# Patient Record
Sex: Female | Born: 1955
Health system: Southern US, Community
[De-identification: ages and names within clinical notes are randomized; demographics above are authoritative.]

## PROBLEM LIST (undated history)

## (undated) DIAGNOSIS — U071 COVID-19: Secondary | ICD-10-CM

## (undated) DIAGNOSIS — E782 Mixed hyperlipidemia: Secondary | ICD-10-CM

## (undated) DIAGNOSIS — R5383 Other fatigue: Principal | ICD-10-CM

## (undated) DIAGNOSIS — Z9889 Other specified postprocedural states: Secondary | ICD-10-CM

## (undated) DIAGNOSIS — D649 Anemia, unspecified: Secondary | ICD-10-CM

## (undated) DIAGNOSIS — I209 Angina pectoris, unspecified: Secondary | ICD-10-CM

## (undated) DIAGNOSIS — R112 Nausea with vomiting, unspecified: Secondary | ICD-10-CM

## (undated) DIAGNOSIS — N200 Calculus of kidney: Secondary | ICD-10-CM

## (undated) HISTORY — DX: Mixed hyperlipidemia: E78.2

## (undated) HISTORY — DX: Other fatigue: R53.83

## (undated) HISTORY — PX: CHOLECYSTECTOMY: SHX55

## (undated) HISTORY — DX: COVID-19: U07.1

## (undated) HISTORY — PX: TUBAL LIGATION: SHX77

---

## 2003-02-22 ENCOUNTER — Ambulatory Visit (HOSPITAL_COMMUNITY): Admission: RE | Admit: 2003-02-22 | Discharge: 2003-02-22 | Payer: Self-pay | Admitting: Family Medicine

## 2003-02-22 ENCOUNTER — Encounter: Payer: Self-pay | Admitting: Family Medicine

## 2007-04-24 ENCOUNTER — Emergency Department (HOSPITAL_COMMUNITY): Admission: EM | Admit: 2007-04-24 | Discharge: 2007-04-24 | Payer: Self-pay | Admitting: Emergency Medicine

## 2009-07-09 DIAGNOSIS — D509 Iron deficiency anemia, unspecified: Secondary | ICD-10-CM

## 2009-07-09 HISTORY — DX: Iron deficiency anemia, unspecified: D50.9

## 2010-04-08 DIAGNOSIS — D649 Anemia, unspecified: Secondary | ICD-10-CM

## 2010-04-08 HISTORY — DX: Anemia, unspecified: D64.9

## 2010-07-09 DIAGNOSIS — N200 Calculus of kidney: Secondary | ICD-10-CM

## 2010-07-09 HISTORY — DX: Calculus of kidney: N20.0

## 2010-12-08 DIAGNOSIS — N200 Calculus of kidney: Secondary | ICD-10-CM

## 2010-12-08 HISTORY — DX: Calculus of kidney: N20.0

## 2010-12-09 ENCOUNTER — Emergency Department (HOSPITAL_COMMUNITY): Payer: BC Managed Care – PPO

## 2010-12-09 ENCOUNTER — Emergency Department (HOSPITAL_COMMUNITY)
Admission: EM | Admit: 2010-12-09 | Discharge: 2010-12-09 | Disposition: A | Discharge status: Home / Self Care | Payer: BC Managed Care – PPO | Attending: Emergency Medicine | Admitting: Emergency Medicine

## 2010-12-09 DIAGNOSIS — R1031 Right lower quadrant pain: Secondary | ICD-10-CM | POA: Insufficient documentation

## 2010-12-09 DIAGNOSIS — R110 Nausea: Secondary | ICD-10-CM | POA: Insufficient documentation

## 2010-12-09 DIAGNOSIS — N201 Calculus of ureter: Secondary | ICD-10-CM | POA: Insufficient documentation

## 2010-12-09 LAB — URINALYSIS, ROUTINE W REFLEX MICROSCOPIC
Glucose, UA: NEGATIVE mg/dL
Nitrite: NEGATIVE
Protein, ur: 100 mg/dL — AB
Specific Gravity, Urine: >1.03 — ABNORMAL HIGH (ref 1.005–1.030)
Urobilinogen, UA: 0.2 mg/dL (ref 0.0–1.0)
pH: 5.5 (ref 5.0–8.0)

## 2010-12-09 LAB — URINE MICROSCOPIC-ADD ON

## 2011-04-18 LAB — POCT I-STAT CREATININE: Creatinine, Ser: 1.1

## 2011-04-18 LAB — I-STAT 8, (EC8 V) (CONVERTED LAB)
BUN: 12
Glucose, Bld: 112 — ABNORMAL HIGH
HCT: 42
Hemoglobin: 14.3
Potassium: 4.3
Sodium: 141
TCO2: 24

## 2011-04-18 LAB — POCT CARDIAC MARKERS: Operator id: 207241

## 2011-05-15 ENCOUNTER — Encounter (HOSPITAL_BASED_OUTPATIENT_CLINIC_OR_DEPARTMENT_OTHER): Payer: Self-pay | Admitting: *Deleted

## 2011-05-15 NOTE — Progress Notes (Signed)
Pt instructed npo p mn 11/7.  To [facility] @ 0615 11/8. Needs ekg on arrival.  To [facility] in [place] [phone]) for cbc.  Labs to be faxed.

## 2011-05-16 NOTE — H&P (Addendum)
55 yo presents for surgical mngt of irregular menses.  US shows possible endometrial polyp.    PMHx:  Kidney stones PSHx:  Cholecystectomy, BTL OBHx:  SVD x 2 All:  Latex Meds:  None SHx:  No tobacco or drugs  AF, VSS - BP mildly elevated Gen - NAD CV - RRR Lungs-clear bilaterlly ABd - soft, NT PV - deferred  [place] - 8mm density within endometrial cavity, probable polyp  A/P:  Irregular VB and probably endometrial mass - plan for hysteroscopy, D&C w/ removal of endometrial polyp  Discussed procedure again and re-examined pt.  Informed consent obtained.

## 2011-05-17 ENCOUNTER — Encounter (HOSPITAL_BASED_OUTPATIENT_CLINIC_OR_DEPARTMENT_OTHER): Payer: Self-pay | Admitting: Anesthesiology

## 2011-05-17 ENCOUNTER — Ambulatory Visit (HOSPITAL_BASED_OUTPATIENT_CLINIC_OR_DEPARTMENT_OTHER)
Admission: RE | Admit: 2011-05-17 | Discharge: 2011-05-17 | Disposition: A | Discharge status: Home / Self Care | Payer: BC Managed Care – PPO | Source: Ambulatory Visit | Attending: Obstetrics and Gynecology | Admitting: Obstetrics and Gynecology

## 2011-05-17 ENCOUNTER — Ambulatory Visit (HOSPITAL_BASED_OUTPATIENT_CLINIC_OR_DEPARTMENT_OTHER): Payer: BC Managed Care – PPO | Admitting: Anesthesiology

## 2011-05-17 ENCOUNTER — Other Ambulatory Visit: Payer: Self-pay | Admitting: Obstetrics and Gynecology

## 2011-05-17 ENCOUNTER — Other Ambulatory Visit: Payer: Self-pay

## 2011-05-17 ENCOUNTER — Encounter (HOSPITAL_BASED_OUTPATIENT_CLINIC_OR_DEPARTMENT_OTHER): Payer: Self-pay | Admitting: Certified Registered"

## 2011-05-17 ENCOUNTER — Encounter (HOSPITAL_BASED_OUTPATIENT_CLINIC_OR_DEPARTMENT_OTHER)
Admission: RE | Disposition: A | Discharge status: Home / Self Care | Payer: Self-pay | Source: Ambulatory Visit | Attending: Obstetrics and Gynecology

## 2011-05-17 DIAGNOSIS — N840 Polyp of corpus uteri: Secondary | ICD-10-CM | POA: Insufficient documentation

## 2011-05-17 DIAGNOSIS — N926 Irregular menstruation, unspecified: Secondary | ICD-10-CM | POA: Insufficient documentation

## 2011-05-17 HISTORY — PX: HYSTEROSCOPY WITH D & C: SHX1775

## 2011-05-17 HISTORY — DX: Calculus of kidney: N20.0

## 2011-05-17 HISTORY — DX: Other specified postprocedural states: Z98.890

## 2011-05-17 HISTORY — DX: Nausea with vomiting, unspecified: R11.2

## 2011-05-17 HISTORY — DX: Anemia, unspecified: D64.9

## 2011-05-17 HISTORY — DX: Angina pectoris, unspecified: I20.9

## 2011-05-17 SURGERY — DILATATION AND CURETTAGE /HYSTEROSCOPY
Anesthesia: Choice | Site: Uterus | Wound class: Clean Contaminated

## 2011-05-17 MED ORDER — LIDOCAINE HCL 1 % IJ SOLN
INTRAMUSCULAR | Status: DC | PRN
  Administered 2011-05-17: 10 mL

## 2011-05-17 MED ORDER — FENTANYL CITRATE 0.05 MG/ML IJ SOLN
INTRAMUSCULAR | Status: DC | PRN
  Administered 2011-05-17: 100 ug via INTRAVENOUS

## 2011-05-17 MED ORDER — DEXTROSE 5 % IV SOLN
1.0000 g | INTRAVENOUS | Status: AC
  Administered 2011-05-17: 1 g via INTRAVENOUS

## 2011-05-17 MED ORDER — GLYCINE 1.5 % IR SOLN
Status: DC | PRN
  Administered 2011-05-17: 3000 mL

## 2011-05-17 MED ORDER — ONDANSETRON HCL 4 MG/2ML IJ SOLN
INTRAMUSCULAR | Status: DC | PRN
  Administered 2011-05-17: 4 mg via INTRAVENOUS

## 2011-05-17 MED ORDER — SCOPOLAMINE 1 MG/3DAYS TD PT72
1.0000 | MEDICATED_PATCH | TRANSDERMAL | Status: DC
  Administered 2011-05-17: 1.5 mg via TRANSDERMAL

## 2011-05-17 MED ORDER — DEXAMETHASONE SODIUM PHOSPHATE 4 MG/ML IJ SOLN
INTRAMUSCULAR | Status: DC | PRN
  Administered 2011-05-17: 8 mg via INTRAVENOUS

## 2011-05-17 MED ORDER — LIDOCAINE HCL (CARDIAC) 20 MG/ML IV SOLN
INTRAVENOUS | Status: DC | PRN
  Administered 2011-05-17: 80 mg via INTRAVENOUS

## 2011-05-17 MED ORDER — DROPERIDOL 2.5 MG/ML IJ SOLN: 0.6250 mg | INTRAMUSCULAR | Status: DC | PRN

## 2011-05-17 MED ORDER — LACTATED RINGERS IV SOLN
INTRAVENOUS | Status: DC
  Administered 2011-05-17: 07:00:00 via INTRAVENOUS

## 2011-05-17 MED ORDER — PROPOFOL 10 MG/ML IV EMUL
INTRAVENOUS | Status: DC | PRN
  Administered 2011-05-17: 200 mg via INTRAVENOUS

## 2011-05-17 MED ORDER — FENTANYL CITRATE 0.05 MG/ML IJ SOLN: 25.0000 ug | INTRAMUSCULAR | Status: DC | PRN

## 2011-05-17 MED ORDER — HYDROCODONE-ACETAMINOPHEN 5-500 MG PO TABS: 1.0000 | ORAL_TABLET | Freq: Four times a day (QID) | ORAL | Status: AC | PRN

## 2011-05-17 MED ORDER — MIDAZOLAM HCL 5 MG/5ML IJ SOLN
INTRAMUSCULAR | Status: DC | PRN
  Administered 2011-05-17: 2 mg via INTRAVENOUS

## 2011-05-17 SURGICAL SUPPLY — 30 items
CANISTER SUCTION 2500CC (MISCELLANEOUS) ×2 IMPLANT
CLOTH BEACON ORANGE TIMEOUT ST (SAFETY) ×2 IMPLANT
CORD ACTIVE DISPOSABLE (ELECTRODE)
CORD ELECTRO ACTIVE DISP (ELECTRODE) ×1 IMPLANT
COVER TABLE BACK 60X90 (DRAPES) ×2 IMPLANT
DRAPE CAMERA CLOSED 9X96 (DRAPES) ×2 IMPLANT
DRAPE LG THREE QUARTER DISP (DRAPES) ×2 IMPLANT
DRESSING TELFA 8X3 (GAUZE/BANDAGES/DRESSINGS) ×2 IMPLANT
ELECT LOOP GYNE PRO 24FR (CUTTING LOOP) ×2
ELECT REM PT RETURN 9FT ADLT (ELECTROSURGICAL) ×2
ELECT VAPORTRODE GRVD BAR (ELECTRODE) IMPLANT
ELECTRODE LOOP GYNE PRO 24FR (CUTTING LOOP) ×1 IMPLANT
ELECTRODE REM PT RTRN 9FT ADLT (ELECTROSURGICAL) ×1 IMPLANT
ELECTRODE ROLLER BARREL 22FR (ELECTROSURGICAL) IMPLANT
ELECTRODE VAPORCUT 22FR (ELECTROSURGICAL) IMPLANT
GLOVE BIO SURGEON STRL SZ 6.5 (GLOVE) ×3 IMPLANT
GLOVE BIOGEL PI IND STRL 7.0 (GLOVE) ×2 IMPLANT
GLOVE BIOGEL PI INDICATOR 7.0 (GLOVE) ×2
GLOVE ECLIPSE 6.0 STRL STRAW (GLOVE) ×1 IMPLANT
GOWN PREVENTION PLUS LG XLONG (DISPOSABLE) ×2 IMPLANT
GOWN SURGICAL LARGE (GOWNS) ×1 IMPLANT
LEGGING LITHOTOMY PAIR STRL (DRAPES) ×2 IMPLANT
LOOP ANGLED CUTTING 22FR (CUTTING LOOP) IMPLANT
PACK BASIN DAY SURGERY FS (CUSTOM PROCEDURE TRAY) ×2 IMPLANT
PAD OB MATERNITY 4.3X12.25 (PERSONAL CARE ITEMS) ×2 IMPLANT
PAD PREP 24X48 CUFFED NSTRL (MISCELLANEOUS) ×2 IMPLANT
TOWEL OR 17X24 6PK STRL BLUE (TOWEL DISPOSABLE) ×4 IMPLANT
TRAY DSU PREP LF (CUSTOM PROCEDURE TRAY) ×2 IMPLANT
TUBING HYDROFLEX HYSTEROSCOPY (TUBING) ×2 IMPLANT
WATER STERILE IRR 500ML POUR (IV SOLUTION) ×2 IMPLANT

## 2011-05-17 NOTE — Anesthesia Preprocedure Evaluation (Addendum)
Anesthesia Evaluation  Patient identified by MRN, date of birth, ID band Patient awake    Reviewed: Allergy & Precautions, H&P , NPO status , Patient's Chart, lab work & pertinent test results  History of Anesthesia Complications (+) PONV  Airway Mallampati: II TM Distance: >3 FB Neck ROM: Full    Dental No notable dental hx. (+) Teeth Intact   Pulmonary neg pulmonary ROS,  clear to auscultation  Pulmonary exam normal       Cardiovascular Angina: anxiety. w/u neg. neg cardio ROS Regular Normal    Neuro/Psych Negative Neurological ROS  Negative Psych ROS   GI/Hepatic negative GI ROS, Neg liver ROS,   Endo/Other  Negative Endocrine ROS  Renal/GU negative Renal ROS  Genitourinary negative   Musculoskeletal negative musculoskeletal ROS (+)   Abdominal   Peds negative pediatric ROS (+)  Hematology negative hematology ROS (+)   Anesthesia Other Findings   Reproductive/Obstetrics negative OB ROS                          Anesthesia Physical Anesthesia Plan  ASA: II  Anesthesia Plan: General   Post-op Pain Management:    Induction: Intravenous  Airway Management Planned: LMA  Additional Equipment:   Intra-op Plan:   Post-operative Plan: Extubation in OR  Informed Consent: I have reviewed the patients History and Physical, chart, labs and discussed the procedure including the risks, benefits and alternatives for the proposed anesthesia with the patient or authorized representative who has indicated his/her understanding and acceptance.   Dental advisory given  Plan Discussed with: CRNA  Anesthesia Plan Comments:         Anesthesia Quick Evaluation

## 2011-05-17 NOTE — Transfer of Care (Signed)
Immediate Anesthesia Transfer of Care Note  Patient: Kari Branch  Procedure(s) Performed:  DILATATION AND CURETTAGE (D&C) /HYSTEROSCOPY - HYSTEROSCOPY D & C  Patient Location: PACU  Anesthesia Type: General  Level of Consciousness: sedated and unresponsive  Airway & Oxygen Therapy: Patient Spontanous Breathing and Patient connected to face mask oxygen  Post-op Assessment: Report given to PACU RN and Post -op Vital signs reviewed and stable  Post vital signs: Reviewed and stable  Complications: No apparent anesthesia complications

## 2011-05-17 NOTE — Anesthesia Postprocedure Evaluation (Signed)
  Anesthesia Post-op Note  Patient: Kari Branch  Procedure(s) Performed:  DILATATION AND CURETTAGE (D&C) /HYSTEROSCOPY - HYSTEROSCOPY D & C  Patient Location: PACU  Anesthesia Type: General  Level of Consciousness: awake and alert   Airway and Oxygen Therapy: Patient Spontanous Breathing  Post-op Pain: mild  Post-op Assessment: Post-op Vital signs reviewed, Patient's Cardiovascular Status Stable, Respiratory Function Stable, Patent Airway and No signs of Nausea or vomiting  Post-op Vital Signs: stable  Complications: No apparent anesthesia complications

## 2011-05-17 NOTE — Op Note (Signed)
NAMESTEPHAIE, DARDIS NO.:  [id number]  MEDICAL RECORD NO.:  [id number]  LOCATION:  [facility]                          FACILITY:  [facility]  PHYSICIAN:  Zelphia Cairo, MD    DATE OF BIRTH:  Feb 22, 1956  DATE OF PROCEDURE:  05/17/2011 DATE OF DISCHARGE:  12/09/2010                              OPERATIVE REPORT   PREOPERATIVE DIAGNOSES: 1. Irregular vaginal bleeding. 2. Endometrial mass.  POSTOPERATIVE DIAGNOSES: 1. Irregular vaginal bleeding. 2. Endometrial mass, path pending  PROCEDURES: 1. Cervical block. 2. Hysteroscopy. 3. Dilation and curettage.  SURGEON:  Zelphia Cairo, MD  ANESTHESIA:  General.  COMPLICATIONS:  None.  SPECIMEN:  Endometrial curettings with polyp.  CONDITION:  Stable to recovery room.  PROCEDURE:  Neriyah was taken to the operating room after informed consent was obtained.  She was given general anesthesia and placed in the dorsal lithotomy position using Allen stirrups.  She was prepped and draped in sterile fashion.  Bivalve speculum was placed in the vagina.  1 cc of 1% lidocaine was injected at 12 o'clock on the cervix and a single-tooth tenaculum was placed on the anterior lip of the cervix.  The remaining 9 cc of 1% lidocaine was then used to perform a cervical block.  The cervix was serially dilated using Pratt dilators and the diagnostic hysteroscope was inserted under direct visualization.  The endometrial cavity appeared atrophic.  A small polyp was noted at the posterior uterine cavity just inside of the internal cervical os.  Otherwise, no masses or abnormalities were noted.  Diagnostic hysteroscope was removed and a gentle curetting was performed.  Specimen was placed on Telfa and passed off to be sent to Pathology.  Diagnostic hysteroscope was inserted.  The polyp was noted to be removed.  No other masses or abnormalities were noted.  Hysteroscope was removed.  Tenaculum was removed from the cervix.  The cervix  was hemostatic.  Speculum was removed.  The patient was taken to the recovery room in stable condition.  Sponge, lap, needle, and instrument counts were correct x2.     Zelphia Cairo, MD     GA/MEDQ  D:  05/17/2011  T:  05/17/2011  Job:  [id number]

## 2011-05-17 NOTE — Anesthesia Procedure Notes (Addendum)
Performed by: Radford Pax   Procedure Name: LMA Insertion Date/Time: 05/17/2011 7:30 AM Performed by: Radford Pax Pre-anesthesia Checklist: Patient identified, Emergency Drugs available, Suction available, Patient being monitored and Timeout performed Patient Re-evaluated:Patient Re-evaluated prior to inductionOxygen Delivery Method: Circle System Utilized Preoxygenation: Pre-oxygenation with 100% oxygen Intubation Type: IV induction Ventilation: Mask ventilation without difficulty LMA: LMA inserted LMA Size: 4.0 Number of attempts: 1 Tube secured with: Tape

## 2011-06-05 ENCOUNTER — Encounter (HOSPITAL_BASED_OUTPATIENT_CLINIC_OR_DEPARTMENT_OTHER): Payer: Self-pay | Admitting: Obstetrics and Gynecology

## 2011-06-11 NOTE — Op Note (Signed)
NAMEBERT, GIVANS NO.:  [id number]  MEDICAL RECORD NO.:  [id number]  LOCATION:                                 FACILITY:  PHYSICIAN:  Zelphia Cairo, MD         DATE OF BIRTH:  DATE OF PROCEDURE:  05/17/2011 DATE OF DISCHARGE:                              OPERATIVE REPORT   PREOPERATIVE DIAGNOSIS:  Irregular menses, suspect endometrial polyp.  POSTOPERATIVE DIAGNOSIS:  Irregular menses, suspect endometrial polyp, pathology pending.  PROCEDURE: 1. Hysteroscopy. 2. Dilation and curettage. 3. Polypectomy.  SURGEON:  Zelphia Cairo, MD  ANESTHESIA:  General.  COMPLICATIONS:  None.  CONDITION:  Stable to recovery room.  PROCEDURE:  Kari Branch was taken to the operating room.  After informed consent was obtained, she was given general anesthesia and placed in the dorsal lithotomy position using Allen stirrups.  She was prepped and draped in sterile fashion and an in-and-out catheter was used to drain her bladder.  Bivalve speculum was placed in the vagina, and a single- tooth tenaculum placed on the anterior lip of the cervix.  The cervix was serially dilated with [facility] [place] dilators, and the diagnostic hysteroscope was inserted under direct visualization.  Endometrial polyp was identified.  The remainder of the endometrial cavity appeared normal.  Hysteroscope was removed, and a gentle curetting was then performed.  Specimen was placed on Telfa and passed off to be sent to pathology.  Polypoid material was noted to be contained within curettings.  Hysteroscope was reinserted, and no other masses or abnormalities were noted.  Polyp was no longer present.  All instruments were removed from the vagina.  The cervix was hemostatic.  The speculum was removed.  The patient was extubated and taken to the recovery room in stable condition.  Sponge, lap, needle, and instrument counts were correct x2.     Zelphia Cairo, MD    GA/MEDQ  D:  06/11/2011  T:   06/11/2011  Job:  [id number]

## 2011-06-21 NOTE — Op Note (Signed)
NAMEBRIELLAH, Kari Branch NO.:  [id number]  MEDICAL RECORD NO.:  [id number]  LOCATION:                                 FACILITY:  PHYSICIAN:  Zelphia Cairo, MD    DATE OF BIRTH:  [birth year]  DATE OF PROCEDURE: DATE OF DISCHARGE:                              OPERATIVE REPORT   PREOPERATIVE DIAGNOSES: 1. Irregular Vaginal Bleeding. 2. Endometrial mass, suspect polyp.  POSTOPERATIVE DIAGNOSES: 1. Irregular Vaginal Bleeding. 2. Endometrial mass, suspect polyp, path pending.  PROCEDURE: 1. Hysteroscopy. 2. D and C. 3. Polypectomy.  SURGEON:  Zelphia Cairo, MD  ANESTHESIA:  General.  COMPLICATIONS:  None.  CONDITION:  Stable to recovery room.  Stable endometrial curettings with polyps to pathology.  DESCRIPTION OF PROCEDURE:  The patient was taken to the operating room. After informed consent was obtained, she was given anesthesia and placed in the dorsal lithotomy position using Allen stirrups.  She was prepped and draped in sterile fashion and an in and out catheter was used to drain her bladder.  Bivalve speculum was placed in the vagina.  Single- tooth tenaculum was placed on the anterior lip of the cervix.  The cervix was then serially dilated using Pratt dilators and the diagnostic hysteroscope was inserted into the endometrial cavity.  Endometrial polyp was noted.  No other abnormalities were seen.  Hysteroscope was removed.  Polyp forceps were introduced and used to grasp the polyp.  A gentle curetting was then performed.  All specimens were placed on Telfa and passed off to be sent to pathology.  The diagnostic hysteroscope was then reinserted.  No other masses or abnormalities were noted.  The polyp was noted to be removed.  Tenaculum and speculum were then removed.  Her cervix was hemostatic.  She was taken to the recovery room in stable condition.  Sponge, lap, needle, and instrument counts were correct x2.     Zelphia Cairo,  MD     GA/MEDQ  D:  06/20/2011  T:  06/21/2011  Job:  [id number]

## 2011-06-26 ENCOUNTER — Telehealth: Payer: Self-pay | Admitting: Oncology

## 2011-06-26 NOTE — Telephone Encounter (Signed)
called pts lmovm to rtn call to scheduled new pt appt for 07/17/2011

## 2011-06-27 ENCOUNTER — Telehealth: Payer: Self-pay | Admitting: Oncology

## 2011-06-27 NOTE — Telephone Encounter (Signed)
received a call back from pt and she confirmed appt for 07/17/2011.  w3ill fax over a letter to Dr. Renaldo Fiddler

## 2011-06-29 ENCOUNTER — Telehealth: Payer: Self-pay | Admitting: Oncology

## 2011-06-29 NOTE — Telephone Encounter (Signed)
Referred by Dr. Zelphia Cairo Dx- Low Plts

## 2011-07-04 ENCOUNTER — Telehealth: Payer: Self-pay | Admitting: Oncology

## 2011-07-04 NOTE — Telephone Encounter (Signed)
Referred by Dr. Renaldo Fiddler DX- MIld Thrombocytopenia

## 2011-07-17 ENCOUNTER — Ambulatory Visit: Payer: BC Managed Care – PPO

## 2011-07-17 ENCOUNTER — Telehealth: Payer: Self-pay | Admitting: Oncology

## 2011-07-17 ENCOUNTER — Other Ambulatory Visit: Payer: BC Managed Care – PPO | Admitting: Lab

## 2011-07-17 ENCOUNTER — Encounter: Payer: Self-pay | Admitting: Oncology

## 2011-07-17 ENCOUNTER — Ambulatory Visit (HOSPITAL_BASED_OUTPATIENT_CLINIC_OR_DEPARTMENT_OTHER): Payer: BC Managed Care – PPO | Admitting: Oncology

## 2011-07-17 DIAGNOSIS — R5383 Other fatigue: Secondary | ICD-10-CM | POA: Insufficient documentation

## 2011-07-17 DIAGNOSIS — D696 Thrombocytopenia, unspecified: Secondary | ICD-10-CM

## 2011-07-17 DIAGNOSIS — R5381 Other malaise: Secondary | ICD-10-CM

## 2011-07-17 HISTORY — DX: Other fatigue: R53.83

## 2011-07-17 LAB — COMPREHENSIVE METABOLIC PANEL
Albumin: 4.2 g/dL (ref 3.5–5.2)
Alkaline Phosphatase: 61 U/L (ref 39–117)
CO2: 28 mEq/L (ref 19–32)
Calcium: 9 mg/dL (ref 8.4–10.5)
Chloride: 104 mEq/L (ref 96–112)
Glucose, Bld: 107 mg/dL — ABNORMAL HIGH (ref 70–99)
Potassium: 4.7 mEq/L (ref 3.5–5.3)
Sodium: 140 mEq/L (ref 135–145)
Total Protein: 6.9 g/dL (ref 6.0–8.3)

## 2011-07-17 LAB — CBC WITH DIFFERENTIAL/PLATELET
Eosinophils Absolute: 0.3 10*3/uL (ref 0.0–0.5)
HGB: 13.9 g/dL (ref 11.6–15.9)
MONO#: 0.4 10*3/uL (ref 0.1–0.9)
MONO%: 6.9 % (ref 0.0–14.0)
NEUT#: 3.7 10*3/uL (ref 1.5–6.5)
RBC: 4.49 10*6/uL (ref 3.70–5.45)
RDW: 13.6 % (ref 11.2–14.5)
WBC: 6.2 10*3/uL (ref 3.9–10.3)
lymph#: 1.7 10*3/uL (ref 0.9–3.3)

## 2011-07-17 NOTE — Progress Notes (Signed)
Kari Branch [id number] October 11, 1955 56 y.o. 07/17/2011 10:04 AM  CC: Dr. Zelphia Cairo  REASON FOR CONSULTATION:  56 year old female with thrombocytopenia being seen for evaluation   REFERRING PHYSICIAN: Dr. Zelphia Cairo  HISTORY OF PRESENT ILLNESS:  Kari Branch is a 56 y.o. female.  With out significant medical history. Most recently patient was seen by Dr. Renaldo Fiddler 4 irregular menses and suspected endometrial polyp. Prior to her surgery she had a CBC that revealed a white count of 7.0 hemoglobin 10.1 hematocrit 34.5 and a platelet count that was normal. She was then taken to the OR on 05/17/2011. She underwent endometrial curettage and polypectomy. The final pathology only revealed benign endometrial polyp and benign endocervical mucosa. There was no evidence of a malignancy. On 05/15/2011 patient did have a repeat CBC performed that revealed a white count of 7.3 and normal hemoglobin was 12.5 and normal but her platelets were low at 124,000. Because of this patient was referred to hematology for evaluation. Clinically now patient seems to be doing well she really is without any complaints Branch than some fatigue. For her anemia she has been on oral iron and she is tolerating it well. She is denying any fevers chills night sweats headaches shortness of breath chest pains palpitations. She has no myalgias or arthralgias. She has not noticed any hematuria hematochezia melena hemoptysis hematemesis or easy bruising or bleeding or any petechiae. Remainder of the 14 point review of systems is negative.   Past Medical History  Diagnosis Date  . PONV (postoperative nausea and vomiting)   . Angina '08    mild episode chest pain , ekg wnl per pt  . Kidney stone 02-Jan-2023    passed on own  . Anemia 10/11    on iron tabs  . Fatigue 07/17/2011    Past Surgical History  Procedure Date  . Tubal ligation '95  . Cholecystectomy '01  . Hysteroscopy w/d&c 05/17/2011    Procedure: DILATATION AND  CURETTAGE (D&C) /HYSTEROSCOPY;  Surgeon: Zelphia Cairo;  Location: [facility];  Service: Gynecology;  Laterality: N/A;  HYSTEROSCOPY D & C    Family History  Problem Relation Age of Onset  . Cancer Mother 3    breast cancer  . Hypertension Father     vascular disease/heart disease  . Cancer Maternal Aunt 40    breast cancer  . Cancer Paternal Uncle     lung cancer    Social History History  Substance Use Topics  . Smoking status: Never Smoker   . Smokeless tobacco: Never Used  . Alcohol Use: No    Allergies  Allergen Reactions  . Latex Swelling    facial    Current Outpatient Prescriptions  Medication Sig Dispense Refill  . b complex vitamins tablet Take 1 tablet by mouth daily.        . cholecalciferol (VITAMIN D) 1000 UNITS tablet Take 400 Units by mouth daily.       . fish oil-omega-3 fatty acids 1000 MG capsule Take 2 g by mouth daily.        . IRON PO Take 65 mg by mouth.       . vitamin C (ASCORBIC ACID) 500 MG tablet Take 500 mg by mouth daily.        . vitamin E 400 UNIT capsule Take 400 Units by mouth daily.          REVIEW OF SYSTEMS:  A comprehensive review of systems was negative.  ECOG performance  Status:  PHYSICAL EXAMINATION: Blood pressure 137/99, pulse 65, temperature 98 F (36.7 C), height 5\' 7"  (1.702 m), weight 167 lb 4.8 oz (75.887 kg). QQV:ZDGLO, healthy, no distress, well nourished and well developed SKIN: skin color, texture, turgor are normal, no rashes or significant lesions HEAD: Normocephalic, No masses, lesions, tenderness or abnormalities EYES: normal, PERRLA, EOMI, Conjunctiva are pink and non-injected EARS: External ears normal OROPHARYNX:no exudate, no erythema, lips, buccal mucosa, and tongue normal and dentition normal  NECK: supple, no adenopathy, no bruits, no JVD, thyroid normal size, non-tender, without nodularity LYMPH:  no palpable lymphadenopathy, no hepatosplenomegaly BREAST:not examined LUNGS:  clear to auscultation and percussion HEART: regular rate & rhythm, no murmurs, no gallops, S1 normal and S2 normal ABDOMEN:abdomen soft, non-tender, normal bowel sounds and no masses or organomegaly BACK: Back symmetric, no curvature., No CVA tenderness, Range of motion is normal EXTREMITIES:no joint deformities, effusion, or inflammation, no edema, no skin discoloration, no clubbing, no cyanosis  NEURO: alert & oriented x 3 with fluent speech, no focal motor/sensory deficits, gait normal, reflexes normal and symmetric    STUDIES/RESULTS: No results found.    ASSESSMENT    56 year old female with mild thrombocytopenia. Her last platelet performed in November 2012 revealed a platelet count of 124,000. On the peripheral smear she was noted to have some large platelets. Because of this she is being evaluated in hematology oncology for possible differential of ITP versus sequestration or increased consumption. The patient and I discussed the differential diagnosis in great detail. I explained the rationale for the workup.    PLAN:    I have repeated his CBC C. Matte. I would like to see what the counts are today my index of suspicion is that her counts are going to be normal. I do think that he was more of a consumptive process rather than a sequestration or a production problem. However if her platelet is continuing to drop then she may need further workup but at this time I would like to see a repeat count on her prior to embarking upon any extensive workup. All questions were answered today. She is set up to see me back in 3 months time of course I will see her sooner if need arises.     All questions were answered. The patient knows to call the clinic with any problems, questions or concerns. We can certainly see the patient much sooner if necessary.  Thank you so much for allowing me to participate in the care of Kari Branch. I will continue to follow up the patient with you and assist in  her care.  I spent 40 minutes counseling the patient face to face. The total time spent in the appointment was 60 minutes. Drue Second, MD Medical/Oncology [facility] [phone] (beeper) [phone] (Office)  07/17/2011, 10:04 AM 07/17/2011, 10:04 AM

## 2011-07-17 NOTE — Telephone Encounter (Signed)
Gv pt appt for april2013 °

## 2011-07-17 NOTE — Patient Instructions (Signed)
Thrombocytopenia Thrombocytopenia is an uncommon, serious blood disease. Thrombocytopenia means there are not enough platelets in your blood. Platelets are necessary for clotting. Platelets are also called thrombocytes. CAUSES Thrombocytopenia is caused by:   Decreased production of platelets.   Increased destruction of platelets.   Enlarged spleen (hypersplenism).  Decreased production of platelets in the bone marrow can be caused by:  Aplastic anemia in which your bone marrow quits making blood cells.   Cancer in the bone marrow.   Certain medicines, including chemotherapy.   Overwhelming infection in the bone marrow.  Increased breakdown of platelets occurs in the bloodstream, spleen, or liver. This category includes:   Immune thrombocytopenic purpura (ITP).   Drug-induced thrombocytopenia.   Thrombotic thrombocytopenic purpura (TTP).   Certain inherited disorders.   Disseminated intravascular coagulation (DIC).  In hypersplenism, an enlarged spleen gathers up platelets from circulation so they are not available to do their job and help with clotting. The spleen can enlarge in cirrhosis and other conditions.  SYMPTOMS  All of the symptoms of thrombocytopenia are essentially a side effect of poor blood clotting. Some of these are:  Abnormal bleeding.   Nosebleeds.   Fever.   Being easily tired (fatigued).   Shortness of breath on exertion.   Changes in awareness.   Confusion.   Purpura. This is purplish discolorations in the skin produced by small bleeding vessels near the surface of the skin.   Bruising.   Yellowish color to the skin (jaundice).   Weakness.   Paleness (pallor).   Heart rate over 100 beats per minute.   Headache.   Speech changes.   Rash that may be petechial. This looks like pinpoint, purplish-red spots on the skin and mucous membranes. It is caused by bleeding from small blood vessels (capillaries).  DIAGNOSIS  Your caregiver will  make this diagnosis based on exam and with the use of blood tests. Routine blood work will show decreased platelets in the blood.  Sometimes, a bone marrow study is done to look for the original cells (megakaryocytes), which make platelets.   A mucus membrane biopsy, if done, may show platelet clots in small blood vessels.  TREATMENT  Treatment depends on the cause of the condition.  In some cases, a replacement (transfusion) of platelets may be required to stop or prevent bleeding.   Sometimes, the spleen must be surgically removed.   Plasmapheresis may be used to remove proteins from the blood that cause platelet destruction.  PROGNOSIS  The outcome depends on the disorder causing the low platelet count and on how low the platelet count falls. Any system in the body can be affected if there is too much bleeding. Some problems are:  Excessive bleeding (hemorrhage).   Vomiting blood or blood in the stools (gastrointestinal bleeding).   Bleeding in the brain (intracranial hemorrhage).  HOME CARE INSTRUCTIONS   Check the skin and linings inside your mouth for bruising or bleeding. Call your caregiver if you develop unexplained bleeding or bruising.   Watch your sputum, urine, and stool for blood. Report any signs of bleeding to your caregiver.   Do not return to any activities that could cause bumps and bruises until given the okay by your caregiver.   Take extra care not to cut yourself when using scissors, needles, knives, and other tools. Take care not to burn yourself when ironing or cooking.   Notify any healthcare providers, including dentists and eye doctors, of your condition.  SEEK IMMEDIATE MEDICAL CARE IF:  You develop headaches or blurred vision.   You develop active bleeding from anywhere in your body.   You become extremely tired, weak, or dizzy.   You become short of breath or develop pain.  Document Released: 06/25/2005 Document Revised: 01/08/2011 Document  Reviewed: 02/20/2007 [facility] Patient Information 2012 [place], [place].

## 2011-07-24 ENCOUNTER — Telehealth: Payer: Self-pay | Admitting: *Deleted

## 2011-07-24 NOTE — Telephone Encounter (Signed)
Notified pt all labs were normal as predicted by Dr. Welton Flakes

## 2011-07-24 NOTE — Telephone Encounter (Signed)
Message copied by Cooper Render on Tue Jul 24, 2011 12:28 PM ------      Message from: Victorino December      Created: Tue Jul 17, 2011 11:09 AM       Call patient: call patient all labs are normal! As predicted by Dr. Welton Flakes

## 2011-09-15 IMAGING — CT CT ABD-PELV W/O CM
2 of 3 series · 17 of 46 positions shown, 19 images · non-contrast
Comparison: None.

CLINICAL DATA: Right-sided flank pain for 3 days, gross hematuria

CT ABDOMEN AND PELVIS WITHOUT CONTRAST
TECHNIQUE: Multidetector CT imaging of the abdomen and pelvis was
performed following the standard protocol without intravenous
contrast.

[Series 2: standard/full over (age)lbs 5.0 · axial · 0.66mm/px · z∈[+556,+951]mm · 14 of 91 slices shown, 16 images]
[im 6/91  soft-tissue]
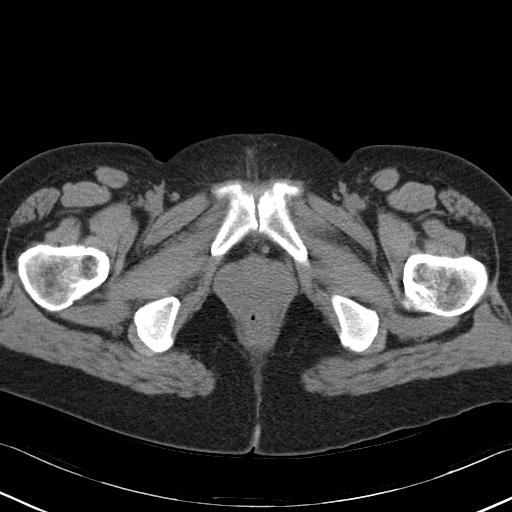
[im 6/91  bone]
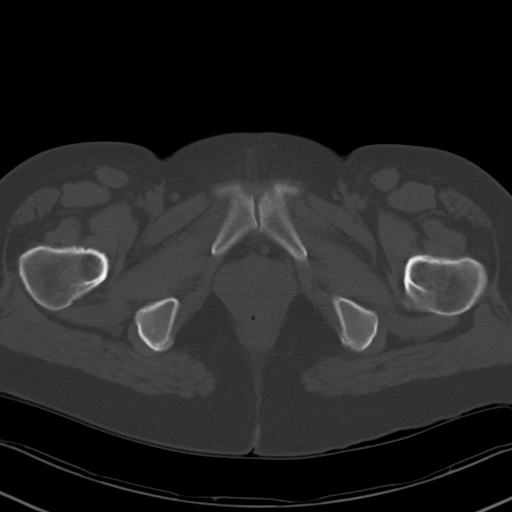
[im 12/91  soft-tissue]
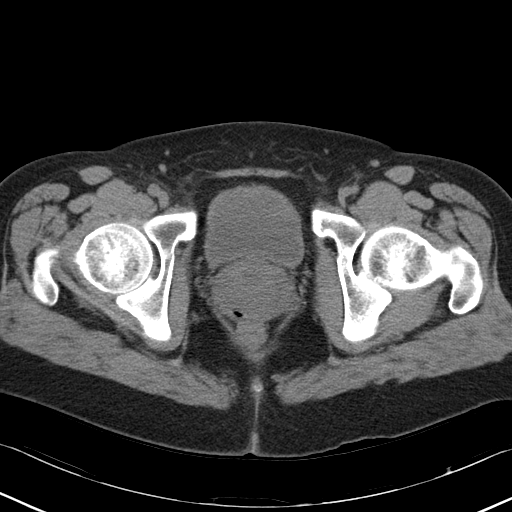
[im 18/91  soft-tissue]
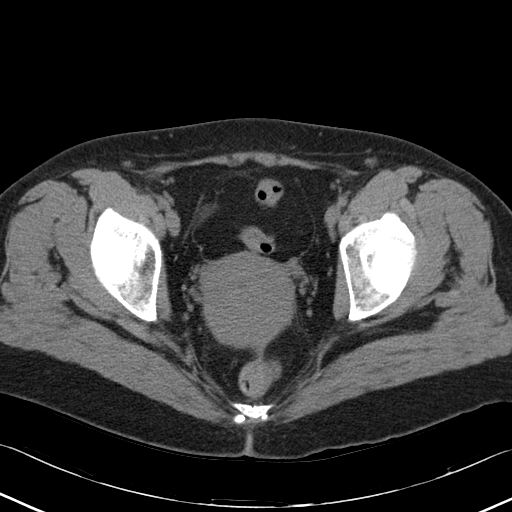
[im 24/91  soft-tissue]
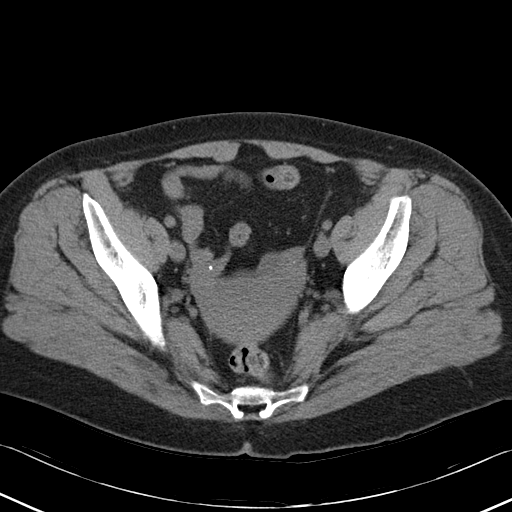
[im 30/91  soft-tissue]
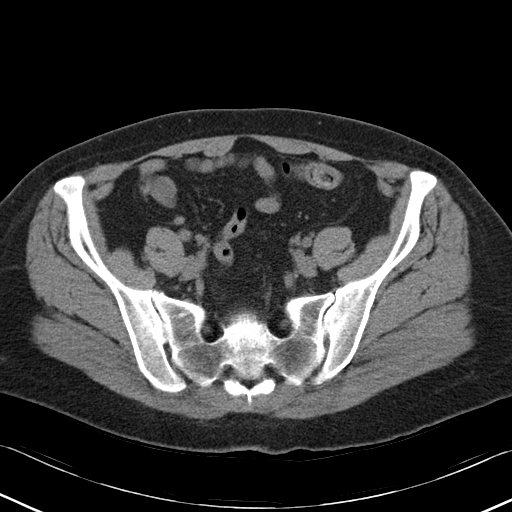
[im 35/91  soft-tissue]
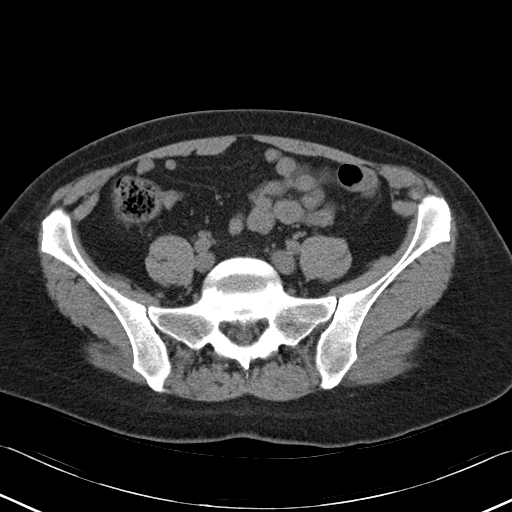
[im 41/91  soft-tissue]
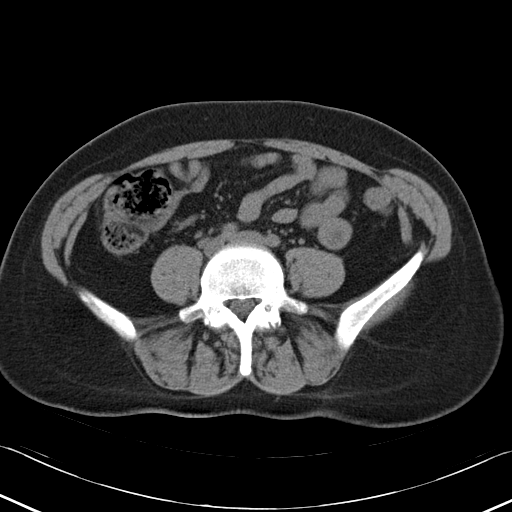
[im 50/91  soft-tissue]
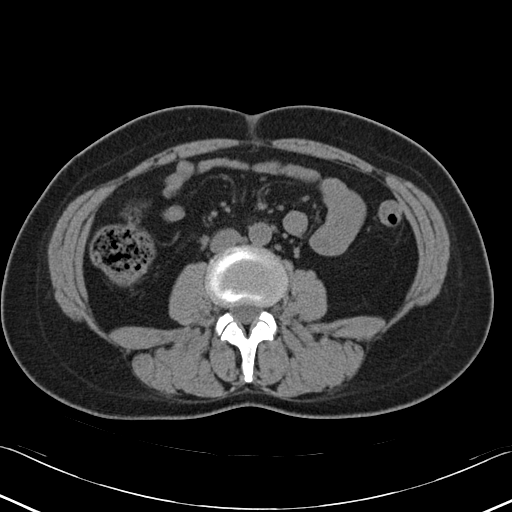
[im 56/91  soft-tissue]
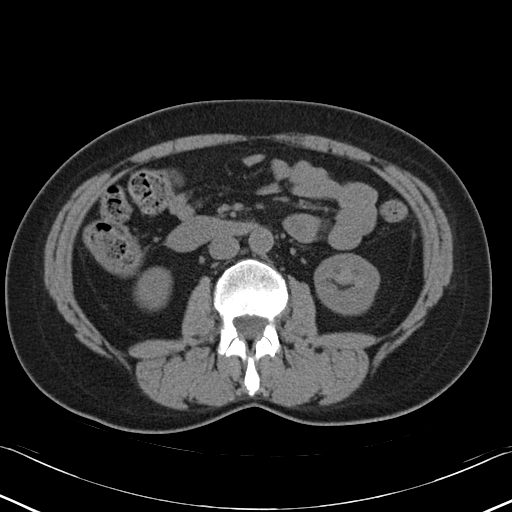
[im 56/91  bone]
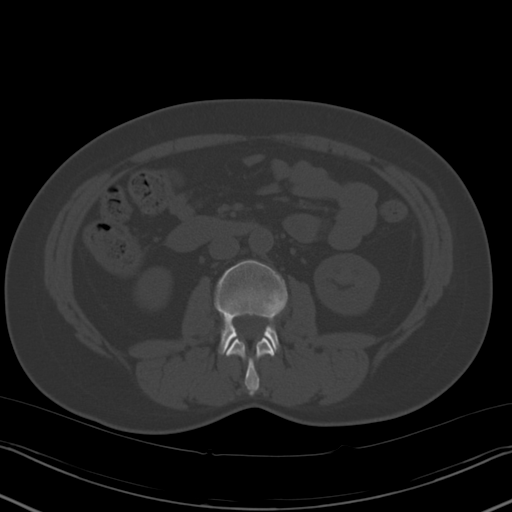
[im 61/91  soft-tissue]
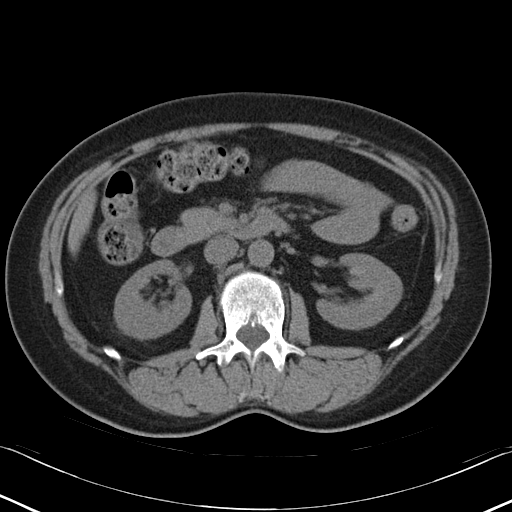
[im 67/91  soft-tissue]
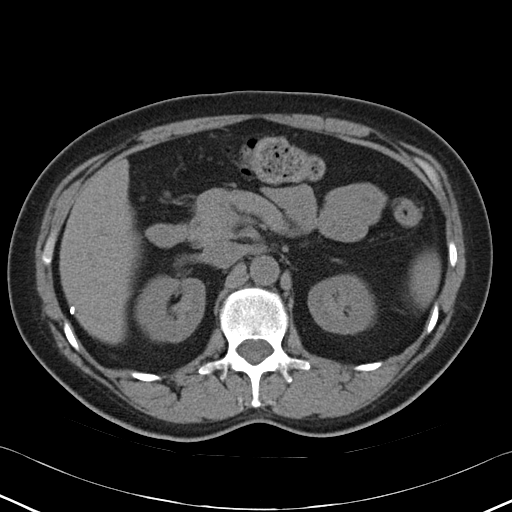
[im 73/91  soft-tissue]
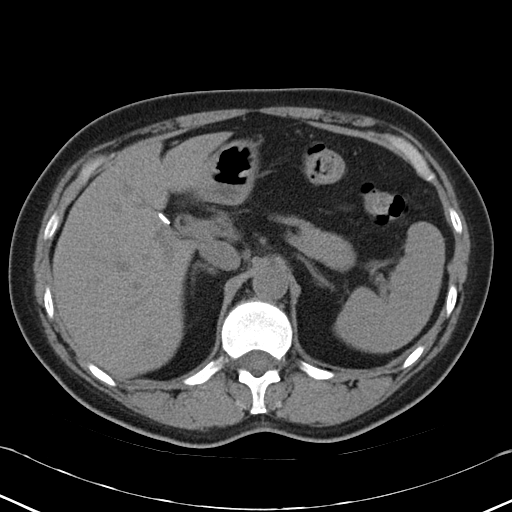
[im 79/91  soft-tissue]
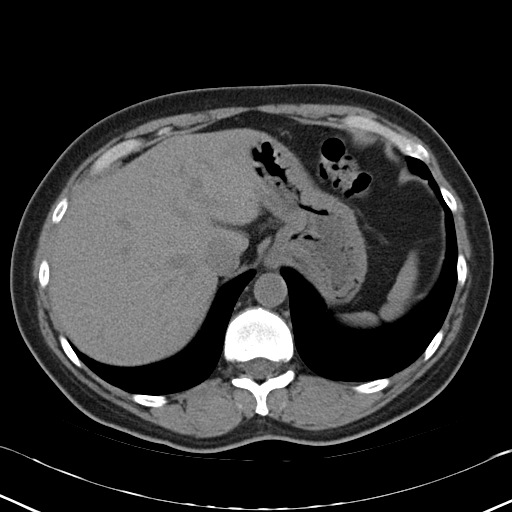
[im 85/91  soft-tissue]
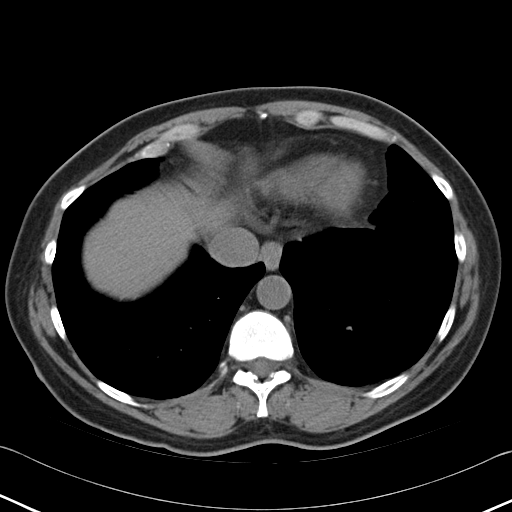

[Series 4: mpr coronal · coronal · 0.78mm/px · 3 of 65 slices shown]
[im 22/65  soft-tissue]
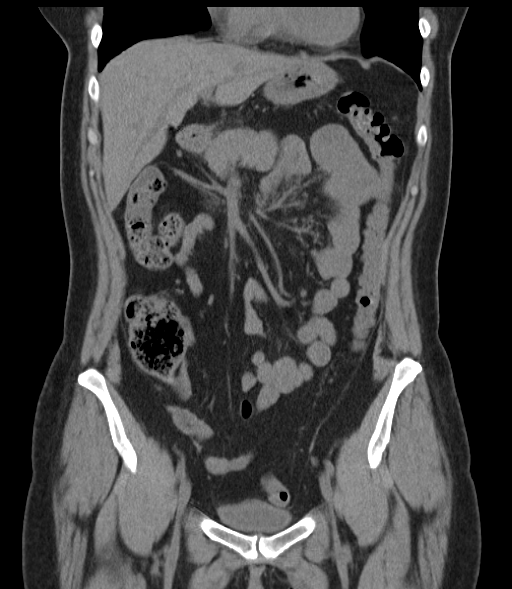
[im 29/65  soft-tissue]
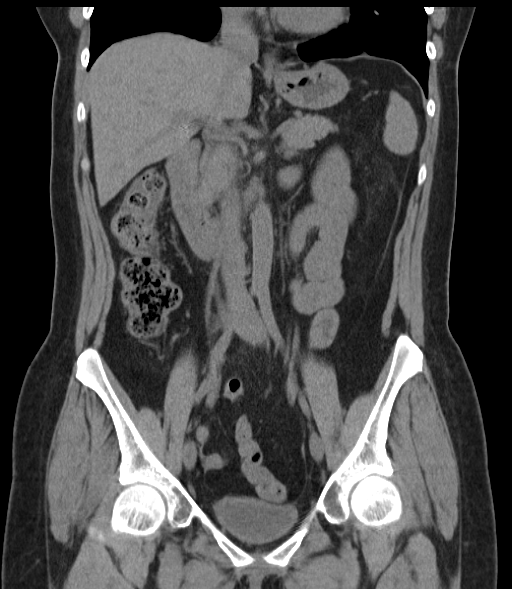
[im 36/65  soft-tissue]
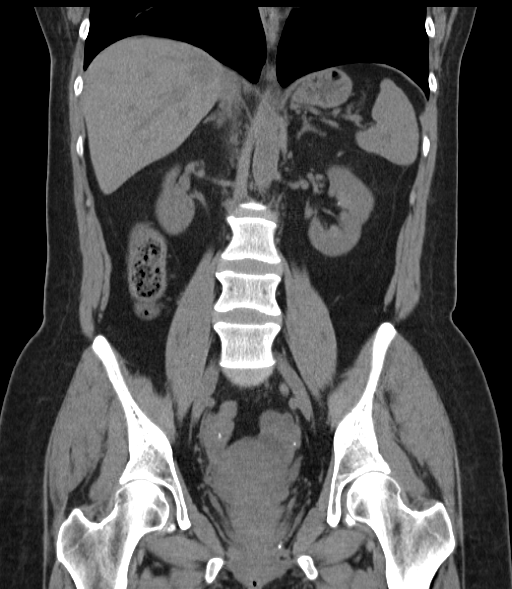

[17 of 46 positions shown; findings below may reference images not displayed]

FINDINGS: The lung bases are clear.  The liver is unremarkable in
the unenhanced state.  Surgical clips are present from prior
cholecystectomy.  The pancreas is normal in size and the pancreatic
duct is not dilated.  The adrenal glands and spleen are
unremarkable.  The stomach is not well distended.  The kidneys are
unremarkable with probable small left renal calculi present.  No
hydronephrosis is seen.  The ureters are not dilated.

However, there is a calcification of approximately 3 mm within the
right posterior urinary bladder most consistent with a passed
calculus.  The urinary bladder otherwise is unremarkable.  The
uterus is normal in size.  A small left ovarian cyst is present.
No free fluid is seen within the pelvis.  The terminal ileum and
the appendix are unremarkable.  No bony abnormality is seen.
IMPRESSION: 1.  3 mm calcification within the urinary bladder most consistent
with a passed calculus.
2.  Small left renal calculi.  No definite hydronephrosis.

## 2011-10-29 ENCOUNTER — Encounter: Payer: Self-pay | Admitting: *Deleted

## 2011-10-29 ENCOUNTER — Telehealth: Payer: Self-pay | Admitting: Oncology

## 2011-10-29 ENCOUNTER — Ambulatory Visit: Payer: BC Managed Care – PPO | Admitting: Oncology

## 2011-10-29 ENCOUNTER — Other Ambulatory Visit: Payer: Self-pay | Admitting: *Deleted

## 2011-10-29 ENCOUNTER — Other Ambulatory Visit: Payer: BC Managed Care – PPO | Admitting: Lab

## 2011-10-29 DIAGNOSIS — D696 Thrombocytopenia, unspecified: Secondary | ICD-10-CM

## 2011-10-29 NOTE — Telephone Encounter (Signed)
S/w the pt and she is aware of her lab and md appt in July per pt's request

## 2011-12-05 ENCOUNTER — Telehealth: Payer: Self-pay | Admitting: Oncology

## 2011-12-05 NOTE — Telephone Encounter (Signed)
lmonvm adviisng the pt of her r/s appts from July to aug due to the md's schedule

## 2011-12-05 NOTE — Telephone Encounter (Signed)
r/s the july appts to aug due to the md is out of the office in july

## 2012-01-02 ENCOUNTER — Ambulatory Visit: Payer: BC Managed Care – PPO | Admitting: Oncology

## 2012-01-02 ENCOUNTER — Other Ambulatory Visit: Payer: BC Managed Care – PPO | Admitting: Lab

## 2012-01-14 ENCOUNTER — Other Ambulatory Visit: Payer: BC Managed Care – PPO | Admitting: Lab

## 2012-01-14 ENCOUNTER — Ambulatory Visit: Payer: BC Managed Care – PPO | Admitting: Oncology

## 2012-02-07 ENCOUNTER — Ambulatory Visit: Payer: BC Managed Care – PPO | Admitting: Oncology

## 2012-02-07 ENCOUNTER — Other Ambulatory Visit: Payer: BC Managed Care – PPO | Admitting: Lab

## 2014-12-07 ENCOUNTER — Other Ambulatory Visit: Payer: Self-pay | Admitting: Gynecology

## 2014-12-08 LAB — CYTOLOGY - PAP

## 2016-07-04 DIAGNOSIS — J069 Acute upper respiratory infection, unspecified: Secondary | ICD-10-CM | POA: Diagnosis not present

## 2016-07-04 DIAGNOSIS — Z1389 Encounter for screening for other disorder: Secondary | ICD-10-CM | POA: Diagnosis not present

## 2016-07-04 DIAGNOSIS — E663 Overweight: Secondary | ICD-10-CM | POA: Diagnosis not present

## 2016-07-04 DIAGNOSIS — Z6826 Body mass index (BMI) 26.0-26.9, adult: Secondary | ICD-10-CM | POA: Diagnosis not present

## 2016-07-04 DIAGNOSIS — J029 Acute pharyngitis, unspecified: Secondary | ICD-10-CM | POA: Diagnosis not present

## 2017-05-28 DIAGNOSIS — L821 Other seborrheic keratosis: Secondary | ICD-10-CM | POA: Diagnosis not present

## 2017-05-28 DIAGNOSIS — L820 Inflamed seborrheic keratosis: Secondary | ICD-10-CM | POA: Diagnosis not present

## 2018-03-27 DIAGNOSIS — Z6825 Body mass index (BMI) 25.0-25.9, adult: Secondary | ICD-10-CM | POA: Diagnosis not present

## 2018-03-27 DIAGNOSIS — J301 Allergic rhinitis due to pollen: Secondary | ICD-10-CM | POA: Diagnosis not present

## 2018-03-27 DIAGNOSIS — E663 Overweight: Secondary | ICD-10-CM | POA: Diagnosis not present

## 2018-03-27 DIAGNOSIS — Z1389 Encounter for screening for other disorder: Secondary | ICD-10-CM | POA: Diagnosis not present

## 2018-04-03 DIAGNOSIS — Z8249 Family history of ischemic heart disease and other diseases of the circulatory system: Secondary | ICD-10-CM | POA: Diagnosis not present

## 2018-04-03 DIAGNOSIS — Z0000 Encounter for general adult medical examination without abnormal findings: Secondary | ICD-10-CM | POA: Diagnosis not present

## 2018-04-03 DIAGNOSIS — E663 Overweight: Secondary | ICD-10-CM | POA: Diagnosis not present

## 2018-04-03 DIAGNOSIS — Z6825 Body mass index (BMI) 25.0-25.9, adult: Secondary | ICD-10-CM | POA: Diagnosis not present

## 2018-06-09 DIAGNOSIS — Z13820 Encounter for screening for osteoporosis: Secondary | ICD-10-CM | POA: Diagnosis not present

## 2018-06-09 DIAGNOSIS — Z6825 Body mass index (BMI) 25.0-25.9, adult: Secondary | ICD-10-CM | POA: Diagnosis not present

## 2018-06-09 DIAGNOSIS — Z1231 Encounter for screening mammogram for malignant neoplasm of breast: Secondary | ICD-10-CM | POA: Diagnosis not present

## 2018-06-09 DIAGNOSIS — Z01419 Encounter for gynecological examination (general) (routine) without abnormal findings: Secondary | ICD-10-CM | POA: Diagnosis not present

## 2018-07-11 DIAGNOSIS — M353 Polymyalgia rheumatica: Secondary | ICD-10-CM | POA: Diagnosis not present

## 2018-07-11 DIAGNOSIS — E663 Overweight: Secondary | ICD-10-CM | POA: Diagnosis not present

## 2018-07-11 DIAGNOSIS — Z1389 Encounter for screening for other disorder: Secondary | ICD-10-CM | POA: Diagnosis not present

## 2018-07-11 DIAGNOSIS — B349 Viral infection, unspecified: Secondary | ICD-10-CM | POA: Diagnosis not present

## 2018-07-11 DIAGNOSIS — J9801 Acute bronchospasm: Secondary | ICD-10-CM | POA: Diagnosis not present

## 2018-07-11 DIAGNOSIS — H6693 Otitis media, unspecified, bilateral: Secondary | ICD-10-CM | POA: Diagnosis not present

## 2018-07-11 DIAGNOSIS — Z6825 Body mass index (BMI) 25.0-25.9, adult: Secondary | ICD-10-CM | POA: Diagnosis not present

## 2018-07-11 DIAGNOSIS — J329 Chronic sinusitis, unspecified: Secondary | ICD-10-CM | POA: Diagnosis not present

## 2018-09-05 DIAGNOSIS — J3089 Other allergic rhinitis: Secondary | ICD-10-CM | POA: Diagnosis not present

## 2018-09-05 DIAGNOSIS — Z6825 Body mass index (BMI) 25.0-25.9, adult: Secondary | ICD-10-CM | POA: Diagnosis not present

## 2018-09-05 DIAGNOSIS — E039 Hypothyroidism, unspecified: Secondary | ICD-10-CM | POA: Diagnosis not present

## 2018-09-05 DIAGNOSIS — E663 Overweight: Secondary | ICD-10-CM | POA: Diagnosis not present

## 2018-09-05 DIAGNOSIS — Z1389 Encounter for screening for other disorder: Secondary | ICD-10-CM | POA: Diagnosis not present

## 2018-12-31 DIAGNOSIS — E663 Overweight: Secondary | ICD-10-CM | POA: Diagnosis not present

## 2018-12-31 DIAGNOSIS — Z6825 Body mass index (BMI) 25.0-25.9, adult: Secondary | ICD-10-CM | POA: Diagnosis not present

## 2018-12-31 DIAGNOSIS — J029 Acute pharyngitis, unspecified: Secondary | ICD-10-CM | POA: Diagnosis not present

## 2019-12-15 DIAGNOSIS — Z13220 Encounter for screening for lipoid disorders: Secondary | ICD-10-CM | POA: Diagnosis not present

## 2019-12-15 DIAGNOSIS — E663 Overweight: Secondary | ICD-10-CM | POA: Diagnosis not present

## 2019-12-15 DIAGNOSIS — Z8249 Family history of ischemic heart disease and other diseases of the circulatory system: Secondary | ICD-10-CM | POA: Diagnosis not present

## 2019-12-18 DIAGNOSIS — Z6826 Body mass index (BMI) 26.0-26.9, adult: Secondary | ICD-10-CM | POA: Diagnosis not present

## 2019-12-18 DIAGNOSIS — Z0001 Encounter for general adult medical examination with abnormal findings: Secondary | ICD-10-CM | POA: Diagnosis not present

## 2019-12-18 DIAGNOSIS — E663 Overweight: Secondary | ICD-10-CM | POA: Diagnosis not present

## 2019-12-18 DIAGNOSIS — Z1389 Encounter for screening for other disorder: Secondary | ICD-10-CM | POA: Diagnosis not present

## 2019-12-18 DIAGNOSIS — E039 Hypothyroidism, unspecified: Secondary | ICD-10-CM | POA: Diagnosis not present

## 2019-12-18 DIAGNOSIS — E7849 Other hyperlipidemia: Secondary | ICD-10-CM | POA: Diagnosis not present

## 2020-01-04 DIAGNOSIS — Z1212 Encounter for screening for malignant neoplasm of rectum: Secondary | ICD-10-CM | POA: Diagnosis not present

## 2020-01-04 DIAGNOSIS — Z1211 Encounter for screening for malignant neoplasm of colon: Secondary | ICD-10-CM | POA: Diagnosis not present

## 2020-02-01 DIAGNOSIS — Z0001 Encounter for general adult medical examination with abnormal findings: Secondary | ICD-10-CM | POA: Diagnosis not present

## 2020-02-01 DIAGNOSIS — Z8249 Family history of ischemic heart disease and other diseases of the circulatory system: Secondary | ICD-10-CM | POA: Diagnosis not present

## 2020-02-01 DIAGNOSIS — Z1389 Encounter for screening for other disorder: Secondary | ICD-10-CM | POA: Diagnosis not present

## 2020-07-19 DIAGNOSIS — E663 Overweight: Secondary | ICD-10-CM | POA: Diagnosis not present

## 2020-07-19 DIAGNOSIS — B029 Zoster without complications: Secondary | ICD-10-CM | POA: Diagnosis not present

## 2020-07-19 DIAGNOSIS — Z6827 Body mass index (BMI) 27.0-27.9, adult: Secondary | ICD-10-CM | POA: Diagnosis not present

## 2020-09-19 DIAGNOSIS — Z01419 Encounter for gynecological examination (general) (routine) without abnormal findings: Secondary | ICD-10-CM | POA: Diagnosis not present

## 2020-09-19 DIAGNOSIS — Z1231 Encounter for screening mammogram for malignant neoplasm of breast: Secondary | ICD-10-CM | POA: Diagnosis not present

## 2020-09-19 DIAGNOSIS — Z6826 Body mass index (BMI) 26.0-26.9, adult: Secondary | ICD-10-CM | POA: Diagnosis not present

## 2020-10-12 ENCOUNTER — Encounter (HOSPITAL_COMMUNITY): Payer: Self-pay

## 2020-10-12 ENCOUNTER — Emergency Department (HOSPITAL_COMMUNITY)
Admission: EM | Admit: 2020-10-12 | Discharge: 2020-10-12 | Disposition: A | Discharge status: Home / Self Care | Payer: BC Managed Care – PPO | Attending: Emergency Medicine | Admitting: Emergency Medicine

## 2020-10-12 ENCOUNTER — Other Ambulatory Visit: Payer: Self-pay

## 2020-10-12 ENCOUNTER — Emergency Department (HOSPITAL_COMMUNITY): Payer: BC Managed Care – PPO

## 2020-10-12 DIAGNOSIS — R61 Generalized hyperhidrosis: Secondary | ICD-10-CM | POA: Diagnosis not present

## 2020-10-12 DIAGNOSIS — Z1331 Encounter for screening for depression: Secondary | ICD-10-CM | POA: Diagnosis not present

## 2020-10-12 DIAGNOSIS — E663 Overweight: Secondary | ICD-10-CM | POA: Diagnosis not present

## 2020-10-12 DIAGNOSIS — Z6826 Body mass index (BMI) 26.0-26.9, adult: Secondary | ICD-10-CM | POA: Diagnosis not present

## 2020-10-12 DIAGNOSIS — R079 Chest pain, unspecified: Secondary | ICD-10-CM | POA: Diagnosis not present

## 2020-10-12 DIAGNOSIS — Z91040 Latex allergy status: Secondary | ICD-10-CM | POA: Insufficient documentation

## 2020-10-12 DIAGNOSIS — K219 Gastro-esophageal reflux disease without esophagitis: Secondary | ICD-10-CM | POA: Diagnosis not present

## 2020-10-12 DIAGNOSIS — R112 Nausea with vomiting, unspecified: Secondary | ICD-10-CM | POA: Diagnosis not present

## 2020-10-12 DIAGNOSIS — R0602 Shortness of breath: Secondary | ICD-10-CM | POA: Diagnosis not present

## 2020-10-12 DIAGNOSIS — Z7982 Long term (current) use of aspirin: Secondary | ICD-10-CM | POA: Diagnosis not present

## 2020-10-12 DIAGNOSIS — R0789 Other chest pain: Secondary | ICD-10-CM | POA: Diagnosis not present

## 2020-10-12 LAB — BASIC METABOLIC PANEL
Anion gap: 10 (ref 5–15)
BUN: 16 mg/dL (ref 8–23)
CO2: 25 mmol/L (ref 22–32)
Calcium: 9.3 mg/dL (ref 8.9–10.3)
Chloride: 106 mmol/L (ref 98–111)
Creatinine, Ser: 1.16 mg/dL — ABNORMAL HIGH (ref 0.44–1.00)
GFR, Estimated: 53 mL/min — ABNORMAL LOW (ref >60)
Glucose, Bld: 98 mg/dL (ref 70–99)
Potassium: 3.8 mmol/L (ref 3.5–5.1)
Sodium: 141 mmol/L (ref 135–145)

## 2020-10-12 LAB — CBC
HCT: 42.7 % (ref 36.0–46.0)
Hemoglobin: 13.9 g/dL (ref 12.0–15.0)
MCH: 31.2 pg (ref 26.0–34.0)
MCHC: 32.6 g/dL (ref 30.0–36.0)
MCV: 95.7 fL (ref 80.0–100.0)
Platelets: 227 10*3/uL (ref 150–400)
RBC: 4.46 MIL/uL (ref 3.87–5.11)
RDW: 12.7 % (ref 11.5–15.5)
WBC: 6.7 10*3/uL (ref 4.0–10.5)
nRBC: 0 % (ref 0.0–0.2)

## 2020-10-12 LAB — TROPONIN I (HIGH SENSITIVITY)
Troponin I (High Sensitivity): <2 ng/L (ref <18)
Troponin I (High Sensitivity): 3 ng/L (ref <18)

## 2020-10-12 MED ORDER — OMEPRAZOLE 20 MG PO CPDR: 20.0000 mg | DELAYED_RELEASE_CAPSULE | Freq: Every day | ORAL | 0 refills | Status: AC

## 2020-10-12 NOTE — ED Provider Notes (Signed)
   Patient signed out to me by Berle Mull, PA-C at end of shift pending second troponin.  Patient 65 year old female here for evaluation of chest pain that has been intermittent.  Advised by PCP to come to the emergency department for evaluation.  Had chest pain 3 days ago.  On exam, patient well-appearing.  She has had a reassuring work-up this evening.  Vital signs reviewed.  No findings suggestive of ACS and second troponin without significant change.  She does endorse chest pain associated with eating and she will be prescribed a PPI as this may be related to GERD.  Patient appears appropriate for discharge home, she agrees to close follow-up with PCP.  Return precautions were discussed.  Labs Reviewed  BASIC METABOLIC PANEL - Abnormal; Notable for the following components:      Result Value   Creatinine, Ser 1.16 (*)    GFR, Estimated 53 (*)    All other components within normal limits  CBC  TROPONIN I (HIGH SENSITIVITY)  TROPONIN I (HIGH SENSITIVITY)      Pauline Aus, PA-C 10/12/20 2020    Terrilee Files, MD 10/13/20 1214

## 2020-10-12 NOTE — ED Triage Notes (Signed)
Pt to er, pt states that she is here for chest pain, states that she has been having pain off and on, states that she was at her doctor and he told her to come to the er for a "rule out", states that her ekg was good, but he gave her a ntg and a baby aspirin.

## 2020-10-12 NOTE — ED Provider Notes (Signed)
[facility] EMERGENCY DEPARTMENT Provider Note   CSN: [id number] Arrival date & time: 10/12/20  1512     History Chief Complaint  Patient presents with  . Chest Pain    Kari Branch is a 65 y.o. female.  HPI   Patient with no significant medical history presents to the emergency department with chief complaint of chest pain rule out.  She endorses that she was at her PCP today and they wanted patient to be further assessed for ACS rule out.  Patient states on Sunday she had pain along her sternum, came on suddenly while she was eating, describes it as a pressure-like sensation which did not radiate, she had associated shortness of breath, became diaphoretic with nausea and vomiting.  She endorsed that it eventually went away on its own, patient states since then she has been having some chest pressure which seems to be worsened with food or or drinking, she has no significant cardiac history, no history of PEs or DVTs, currently not on hormone therapy, no history of Marfan's disease or connective tissue disorders.  Patient denies any alleviating factors.  Patient denies headaches, fevers, chills, shortness of breath, chest pain, abdominal pain, nausea, vomiting, diarrhea, worsening pedal edema.  Past Medical History:  Diagnosis Date  . Anemia 10/11   on iron tabs  . Angina '08   mild episode chest pain , ekg wnl per pt  . Fatigue 07/17/2011  . Kidney stone 12/20/22   passed on own  . PONV (postoperative nausea and vomiting)     Patient Active Problem List   Diagnosis Date Noted  . Fatigue 07/17/2011    Past Surgical History:  Procedure Laterality Date  . CHOLECYSTECTOMY  '01  . HYSTEROSCOPY WITH D & C  05/17/2011   Procedure: DILATATION AND CURETTAGE (D&C) /HYSTEROSCOPY;  Surgeon: Zelphia Cairo;  Location: [facility];  Service: Gynecology;  Laterality: N/A;  HYSTEROSCOPY D & C  . TUBAL LIGATION  '95     OB History   No obstetric history on file.     Family  History  Problem Relation Age of Onset  . Cancer Mother 11       breast cancer  . Hypertension Father        vascular disease/heart disease  . Cancer Maternal Aunt 40       breast cancer  . Cancer Paternal Uncle        lung cancer    Social History   Tobacco Use  . Smoking status: Never Smoker  . Smokeless tobacco: Never Used  Vaping Use  . Vaping Use: Never used  Substance Use Topics  . Alcohol use: No  . Drug use: No    Home Medications Prior to Admission medications   Medication Sig Start Date End Date Taking? Authorizing Provider  aspirin EC 81 MG tablet Take 81 mg by mouth daily. Swallow whole.   Yes [provider]  b complex vitamins tablet Take 1 tablet by mouth daily.   Yes [provider]  cholecalciferol (VITAMIN D) 1000 UNITS tablet Take 400 Units by mouth daily.   Yes [provider]  fexofenadine (ALLEGRA ODT) 30 MG disintegrating tablet Take 30 mg by mouth daily.   Yes [provider]  fish oil-omega-3 fatty acids 1000 MG capsule Take 2 g by mouth daily.   Yes [provider]  levothyroxine (SYNTHROID) 50 MCG tablet Take 50 mcg by mouth daily. 09/10/20  Yes [provider]  omeprazole (  PRILOSEC) 20 MG capsule Take 1 capsule (20 mg total) by mouth daily. 10/12/20 11/11/20 Yes Carroll SageFaulkner, Azekiel Cremer J, PA-C  valACYclovir (VALTREX) 500 MG tablet Take by mouth daily as needed (cold sore). 07/19/20  Yes [provider]  IRON PO Take 65 mg by mouth.  Patient not taking: No sig reported    [provider]  vitamin C (ASCORBIC ACID) 500 MG tablet Take 500 mg by mouth daily.   Patient not taking: No sig reported    [provider]  vitamin E 400 UNIT capsule Take 400 Units by mouth daily.   Patient not taking: No sig reported    [provider]    Allergies    Latex  Review of Systems   Review of Systems  Constitutional: Negative for chills and fever.  HENT: Negative for congestion.    Respiratory: Negative for shortness of breath.   Cardiovascular: Negative for chest pain.  Gastrointestinal: Negative for abdominal pain, diarrhea and vomiting.  Genitourinary: Negative for enuresis and flank pain.  Musculoskeletal: Negative for back pain.  Skin: Negative for rash.  Neurological: Negative for dizziness and headaches.  Hematological: Does not bruise/bleed easily.    Physical Exam Updated Vital Signs BP (!) 155/75   Pulse 60   Temp 98.1 F (36.7 C) (Oral)   Resp 14   Ht 5\' 7"  (1.702 m)   Wt 77.1 kg   SpO2 100%   BMI 26.63 kg/m   Physical Exam Vitals and nursing note reviewed.  Constitutional:      General: She is not in acute distress.    Appearance: She is not ill-appearing.  HENT:     Head: Normocephalic and atraumatic.     Nose: No congestion.  Eyes:     Conjunctiva/sclera: Conjunctivae normal.  Cardiovascular:     Rate and Rhythm: Normal rate and regular rhythm.     Pulses: Normal pulses.     Heart sounds: No murmur heard. No friction rub. No gallop.      Comments: Patient has no chest pain at this time. Pulmonary:     Effort: No respiratory distress.     Breath sounds: No wheezing, rhonchi or rales.  Abdominal:     Palpations: Abdomen is soft.     Tenderness: There is no abdominal tenderness. There is no right CVA tenderness or left CVA tenderness.  Musculoskeletal:     Right lower leg: No edema.     Left lower leg: No edema.  Skin:    General: Skin is warm and dry.  Neurological:     Mental Status: She is alert.  Psychiatric:        Mood and Affect: Mood normal.     ED Results / Procedures / Treatments   Labs (all labs ordered are listed, but only abnormal results are displayed) Labs Reviewed  BASIC METABOLIC PANEL - Abnormal; Notable for the following components:      Result Value   Creatinine, Ser 1.16 (*)    GFR, Estimated 53 (*)    All other components within normal limits  CBC  TROPONIN I (HIGH SENSITIVITY)  TROPONIN I  (HIGH SENSITIVITY)    EKG EKG Interpretation  Date/Time:  Wednesday October 12 2020 15:34:40 EDT Ventricular Rate:  68 PR Interval:  158 QRS Duration: 90 QT Interval:  398 QTC Calculation: 423 R Axis:   54 Text Interpretation: Normal sinus rhythm Normal ECG No significant change since prior 11/12 Confirmed by Meridee ScoreButler, Michael [id number]) on 10/12/2020 3:46:17 PM  Radiology DG Chest 2 View  Result Date: 10/12/2020 CLINICAL DATA:  Chest pain. EXAM: CHEST - 2 VIEW COMPARISON:  April 23, 2007. FINDINGS: The heart size and mediastinal contours are within normal limits. Both lungs are clear. No pneumothorax or pleural effusion is noted. The visualized skeletal structures are unremarkable. IMPRESSION: No active cardiopulmonary disease. Electronically Signed   By: Lupita Raider M.D.   On: 10/12/2020 16:51    Procedures Procedures   Medications Ordered in ED Medications - No data to display  ED Course  I have reviewed the triage vital signs and the nursing notes.  Pertinent labs & imaging results that were available during my care of the patient were reviewed by me and considered in my medical decision making (see chart for details).  Clinical Course as of 10/12/20 1859  Wed Oct 12, 2020  2079 65 year old female complaining of an episode 3 days ago of chest pain associate with nausea and vomiting.  She had a few episodes of chest pain in the past but not with nausea and vomiting.  She saw her PCP who sent her appear to have a rule out.  It sounds like they can send her to cardiology if no significant findings with [place].  She is otherwise well-appearing benign looking EKG and first troponin negative.  Anticipate discharge with second troponin flat [MB]    Clinical Course User Index [MB] Terrilee Files, MD   MDM Rules/Calculators/A&P                         Initial impression-patient presents with ACS rule out.  She is alert, does not appear in acute distress, vital signs reassuring.  Will  obtain chest pain work-up, EKG, chest x-ray and reassess.  Work-up-CBC unremarkable, BMP shows slightly elevated creatinine 1.16, this troponin is less than 2.  Chest x-ray negative for acute findings.  EKG sinus without signs of ischemia no ST elevation or depression noted.  Rule out- I have low suspicion for ACS as history is atypical, patient has no cardiac history, EKG was sinus rhythm without signs of ischemia, first troponin was less than 2, second troponin pending at this time.  low suspicion for PE as patient denies pleuritic chest pain, shortness of breath, patient denies leg pain, no pedal edema noted on exam, patient was PERC negative.  Low suspicion for AAA or aortic dissection as history is atypical, patient has low risk factors.  Low suspicion for systemic infection as patient is nontoxic-appearing, vital signs reassuring, no obvious source infection noted on exam.  Plan-patient has atypical chest pain with unknown etiology, differential diagnosis includes GERD, costochondritis, surgical angina, anxiety, muscular strain.  I suspect this may be more due to GERD as she endorses food and drinking makes the pain worse, will start on a PPI have her follow-up with primary care doctor for further evaluation.  Due to shift change patient will handed to [facility], PA-C, she was provided HPI, current work-up, likely disposition pending second normal troponin patient can be discharged home.   Final Clinical Impression(s) / ED Diagnoses Final diagnoses:  Atypical chest pain    Rx / DC Orders ED Discharge Orders         Ordered    omeprazole (PRILOSEC) 20 MG capsule  Daily        10/12/20 1814           Carroll Sage, PA-C 10/12/20 1903    Terrilee Files,  MD 10/13/20 1213

## 2020-10-12 NOTE — Discharge Instructions (Addendum)
Lab work and imaging all look reassuring, I started you on a acid pill please take them as prescribed.  Please follow-up your PCP for further evaluation.  Come back to the emergency department if you develop chest pain, shortness of breath, severe abdominal pain, uncontrolled nausea, vomiting, diarrhea.

## 2020-12-14 ENCOUNTER — Telehealth: Payer: Self-pay

## 2020-12-14 NOTE — Telephone Encounter (Signed)
Faxed records to [facility] office for patient's appointment with Dr. Diona Browner scheduled for 02/08/21

## 2020-12-14 NOTE — Telephone Encounter (Signed)
Noted  

## 2021-01-06 ENCOUNTER — Ambulatory Visit: Payer: BC Managed Care – PPO | Admitting: Cardiology

## 2021-01-10 DIAGNOSIS — N3000 Acute cystitis without hematuria: Secondary | ICD-10-CM | POA: Diagnosis not present

## 2021-01-10 DIAGNOSIS — Z6827 Body mass index (BMI) 27.0-27.9, adult: Secondary | ICD-10-CM | POA: Diagnosis not present

## 2021-01-10 DIAGNOSIS — E663 Overweight: Secondary | ICD-10-CM | POA: Diagnosis not present

## 2021-01-10 DIAGNOSIS — E039 Hypothyroidism, unspecified: Secondary | ICD-10-CM | POA: Diagnosis not present

## 2021-01-10 DIAGNOSIS — E063 Autoimmune thyroiditis: Secondary | ICD-10-CM | POA: Diagnosis not present

## 2021-02-07 ENCOUNTER — Encounter: Payer: Self-pay | Admitting: Cardiology

## 2021-02-07 NOTE — Progress Notes (Signed)
Cardiology Office Note  Date: 02/08/2021   ID: Kari, Branch Dec 05, 1955, MRN [id number]  PCP:  Assunta Found, MD  Cardiologist:  Nona Dell, MD Electrophysiologist:  None   Chief Complaint  Patient presents with   History of chest pain     History of Present Illness: Kari Branch is a 65 y.o. female referred for cardiology consultation by Dr. Phillips Odor for evaluation of chest pain.  She tells me that she has had periodic episodes of chest discomfort associated with eating.  The episode in question leading to consultation occurred back in April.  She was eating some baked chicken after church and felt like it got stuck in her throat.  She had continued discomfort in her chest after this occurred, eventually lay down and had an episode of emesis with relief of symptoms.  She only had a mild discomfort in her chest thereafter, more of a soreness.  She was given nitroglycerin and sent to the ER by Dr. Phillips Odor.  High-sensitivity troponin I levels were normal at that time as was her ECG which I personally reviewed.  Chest x-ray reported no acute process. She has had prior similar episodes to this always associated with eating.  Otherwise, no exertional chest discomfort or unusual shortness of breath.  She works for a [employer] firm.  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  She does not exercise regularly at this time.  She states that her father had heart disease.  Her personal estimated 10-year risk of CVD is 4.8%.  She does have hyperlipidemia as noted below, LDL was 176 in June 2021.  She has not been on statin therapy.  No documented personal history of vascular disease.  She has been taking an aspirin over the last year.  Past Medical History:  Diagnosis Date   COVID-24 January 2021   Iron deficiency anemia 2011   Mixed hyperlipidemia    Nephrolithiasis 2012    Past Surgical History:  Procedure Laterality Date   CHOLECYSTECTOMY  '01   HYSTEROSCOPY  WITH D & C  05/17/2011   Procedure: DILATATION AND CURETTAGE (D&C) /HYSTEROSCOPY;  Surgeon: Zelphia Cairo;  Location: [facility];  Service: Gynecology;  Laterality: N/A;  HYSTEROSCOPY D & C   TUBAL LIGATION  '95    Current Outpatient Medications  Medication Sig Dispense Refill   aspirin EC 81 MG tablet Take 81 mg by mouth daily. Swallow whole.     b complex vitamins tablet Take 1 tablet by mouth daily.     cholecalciferol (VITAMIN D) 1000 UNITS tablet Take 400 Units by mouth daily.     fexofenadine (ALLEGRA ODT) 30 MG disintegrating tablet Take 30 mg by mouth daily.     fish oil-omega-3 fatty acids 1000 MG capsule Take 2 g by mouth daily.     fluticasone (FLONASE) 50 MCG/ACT nasal spray Place into both nostrils daily.     levothyroxine (SYNTHROID) 50 MCG tablet Take 50 mcg by mouth daily.     omeprazole (PRILOSEC) 20 MG capsule Take 1 capsule (20 mg total) by mouth daily. 30 capsule 0   valACYclovir (VALTREX) 500 MG tablet Take by mouth daily as needed (cold sore).     IRON PO Take 65 mg by mouth.  (Patient not taking: No sig reported)     vitamin C (ASCORBIC ACID) 500 MG tablet Take 500 mg by mouth daily.   (Patient not taking: No sig reported)  vitamin E 400 UNIT capsule Take 400 Units by mouth daily.   (Patient not taking: No sig reported)     No current facility-administered medications for this visit.   Allergies:  Latex   Social History: The patient  reports that she has never smoked. She has never used smokeless tobacco. She reports that she does not drink alcohol and does not use drugs.   Family History: The patient's family history includes Breast cancer in her maternal aunt and mother; Heart disease in her father; Hypertension in her father; Lung cancer in her paternal uncle.   ROS: No palpitations or syncope.  Physical Exam: VS:  BP 128/74   Pulse 64   Ht 5\' 7"  (1.702 m)   Wt 178 lb 6.4 oz (80.9 kg)   SpO2 99%   BMI 27.94 kg/m , BMI Body mass index  is 27.94 kg/m.  Wt Readings from Last 3 Encounters:  02/08/21 178 lb 6.4 oz (80.9 kg)  10/12/20 170 lb (77.1 kg)  07/17/11 167 lb 4.8 oz (75.9 kg)    General: Patient appears comfortable at rest. HEENT: Conjunctiva and lids normal, wearing a mask. Neck: Supple, no elevated JVP or carotid bruits, no thyromegaly. Lungs: Clear to auscultation, nonlabored breathing at rest. Cardiac: Regular rate and rhythm, no S3 or significant systolic murmur, no pericardial rub. Abdomen: Soft, bowel sounds present. Extremities: No pitting edema, distal pulses 2+. Skin: Warm and dry. Musculoskeletal: No kyphosis. Neuropsychiatric: Alert and oriented x3, affect grossly appropriate.  ECG:  An ECG dated 10/12/2020 was personally reviewed today and demonstrated:  Sinus rhythm.  Recent Labwork: 10/12/2020: BUN 16; Creatinine, Ser 1.16; Hemoglobin 13.9; Platelets 227; Potassium 3.8; Sodium 141  June 2021: Cholesterol 253, HDL 55, LDL 176, triglycerides 122  Other Studies Reviewed Today:  Chest x-ray 10/12/2020: FINDINGS: The heart size and mediastinal contours are within normal limits. Both lungs are clear. No pneumothorax or pleural effusion is noted. The visualized skeletal structures are unremarkable.   IMPRESSION: No active cardiopulmonary disease.  Assessment and Plan:  1.  History of chest pain as outlined above, episodes associated after certain meals and most recently in April had frank dysphagia.  She has not had any recurring symptoms on over-the-counter omeprazole.  Etiology most likely gastrointestinal rather than cardiovascular based on description.  Work-up in the ER was reassuring as well.  We will continue observation for now, could always consider GI consultation if symptoms worsen.  2.  General cardiovascular risk assessment is low.  Estimated 10-year CVD risk is 4.8% based on standard calculator.  She does have elevated lipids with LDL 176, somewhat of a gray zone as to whether statin  therapy would be indicated but worth discussion when she has follow-up lipids with her PCP in September.  No clear indication to continue aspirin at this point.  We did discuss a basic walking plan.  Medication Adjustments/Labs and Tests Ordered: Current medicines are reviewed at length with the patient today.  Concerns regarding medicines are outlined above.   Tests Ordered: No orders of the defined types were placed in this encounter.   Medication Changes: No orders of the defined types were placed in this encounter.   Disposition:  Follow up prn  Signed, October, MD, [facility] 02/08/2021 1:43 PM     [facility] [address] [place], [place] Phone: [phone]; Fax: [phone]

## 2021-02-08 ENCOUNTER — Other Ambulatory Visit: Payer: Self-pay

## 2021-02-08 ENCOUNTER — Encounter: Payer: Self-pay | Admitting: Cardiology

## 2021-02-08 ENCOUNTER — Ambulatory Visit (INDEPENDENT_AMBULATORY_CARE_PROVIDER_SITE_OTHER): Payer: BC Managed Care – PPO | Admitting: Cardiology

## 2021-02-08 VITALS — BP 128/74 | HR 64 | Ht 67.0 in | Wt 178.4 lb

## 2021-02-08 DIAGNOSIS — E782 Mixed hyperlipidemia: Secondary | ICD-10-CM | POA: Diagnosis not present

## 2021-02-08 DIAGNOSIS — R0789 Other chest pain: Secondary | ICD-10-CM | POA: Diagnosis not present

## 2021-02-08 NOTE — Patient Instructions (Signed)
Medication Instructions:  Your physician recommends that you continue on your current medications as directed. Please refer to the Current Medication list given to you today.  *If you need a refill on your cardiac medications before your next appointment, please call your pharmacy*   Lab Work: NONE   If you have labs (blood work) drawn today and your tests are completely normal, you will receive your results only by: MyChart Message (if you have MyChart) OR A paper copy in the mail If you have any lab test that is abnormal or we need to change your treatment, we will call you to review the results.   Testing/Procedures: NONE    Follow-Up: At [facility], you and your health needs are our priority.  As part of our continuing mission to provide you with exceptional heart care, we have created designated Provider Care Teams.  These Care Teams include your primary Cardiologist (physician) and Advanced Practice Providers (APPs -  Physician Assistants and Nurse Practitioners) who all work together to provide you with the care you need, when you need it.  We recommend signing up for the patient portal called "MyChart".  Sign up information is provided on this After Visit Summary.  MyChart is used to connect with patients for Virtual Visits (Telemedicine).  Patients are able to view lab/test results, encounter notes, upcoming appointments, etc.  Non-urgent messages can be sent to your provider as well.   To learn more about what you can do with MyChart, go to [email or web address].    Your next appointment:    As Needed   The format for your next appointment:   In Person  Provider:   Samuel McDowell, MD   Other Instructions Thank you for choosing [facility]!    

## 2021-05-03 DIAGNOSIS — E7849 Other hyperlipidemia: Secondary | ICD-10-CM | POA: Diagnosis not present

## 2021-05-03 DIAGNOSIS — Z6828 Body mass index (BMI) 28.0-28.9, adult: Secondary | ICD-10-CM | POA: Diagnosis not present

## 2021-05-03 DIAGNOSIS — E663 Overweight: Secondary | ICD-10-CM | POA: Diagnosis not present

## 2021-05-03 DIAGNOSIS — E039 Hypothyroidism, unspecified: Secondary | ICD-10-CM | POA: Diagnosis not present

## 2021-05-03 DIAGNOSIS — E063 Autoimmune thyroiditis: Secondary | ICD-10-CM | POA: Diagnosis not present

## 2021-05-03 DIAGNOSIS — Z0000 Encounter for general adult medical examination without abnormal findings: Secondary | ICD-10-CM | POA: Diagnosis not present

## 2021-05-03 DIAGNOSIS — Z23 Encounter for immunization: Secondary | ICD-10-CM | POA: Diagnosis not present

## 2021-05-03 DIAGNOSIS — E782 Mixed hyperlipidemia: Secondary | ICD-10-CM | POA: Diagnosis not present

## 2021-05-03 DIAGNOSIS — Z8249 Family history of ischemic heart disease and other diseases of the circulatory system: Secondary | ICD-10-CM | POA: Diagnosis not present

## 2021-07-19 IMAGING — DX DG CHEST 2V
2 series · 2 of 2 positions shown · non-contrast
Comparison: April 23, 2007.

CLINICAL DATA: Chest pain.

EXAM:
CHEST - 2 VIEW

[chest pa]
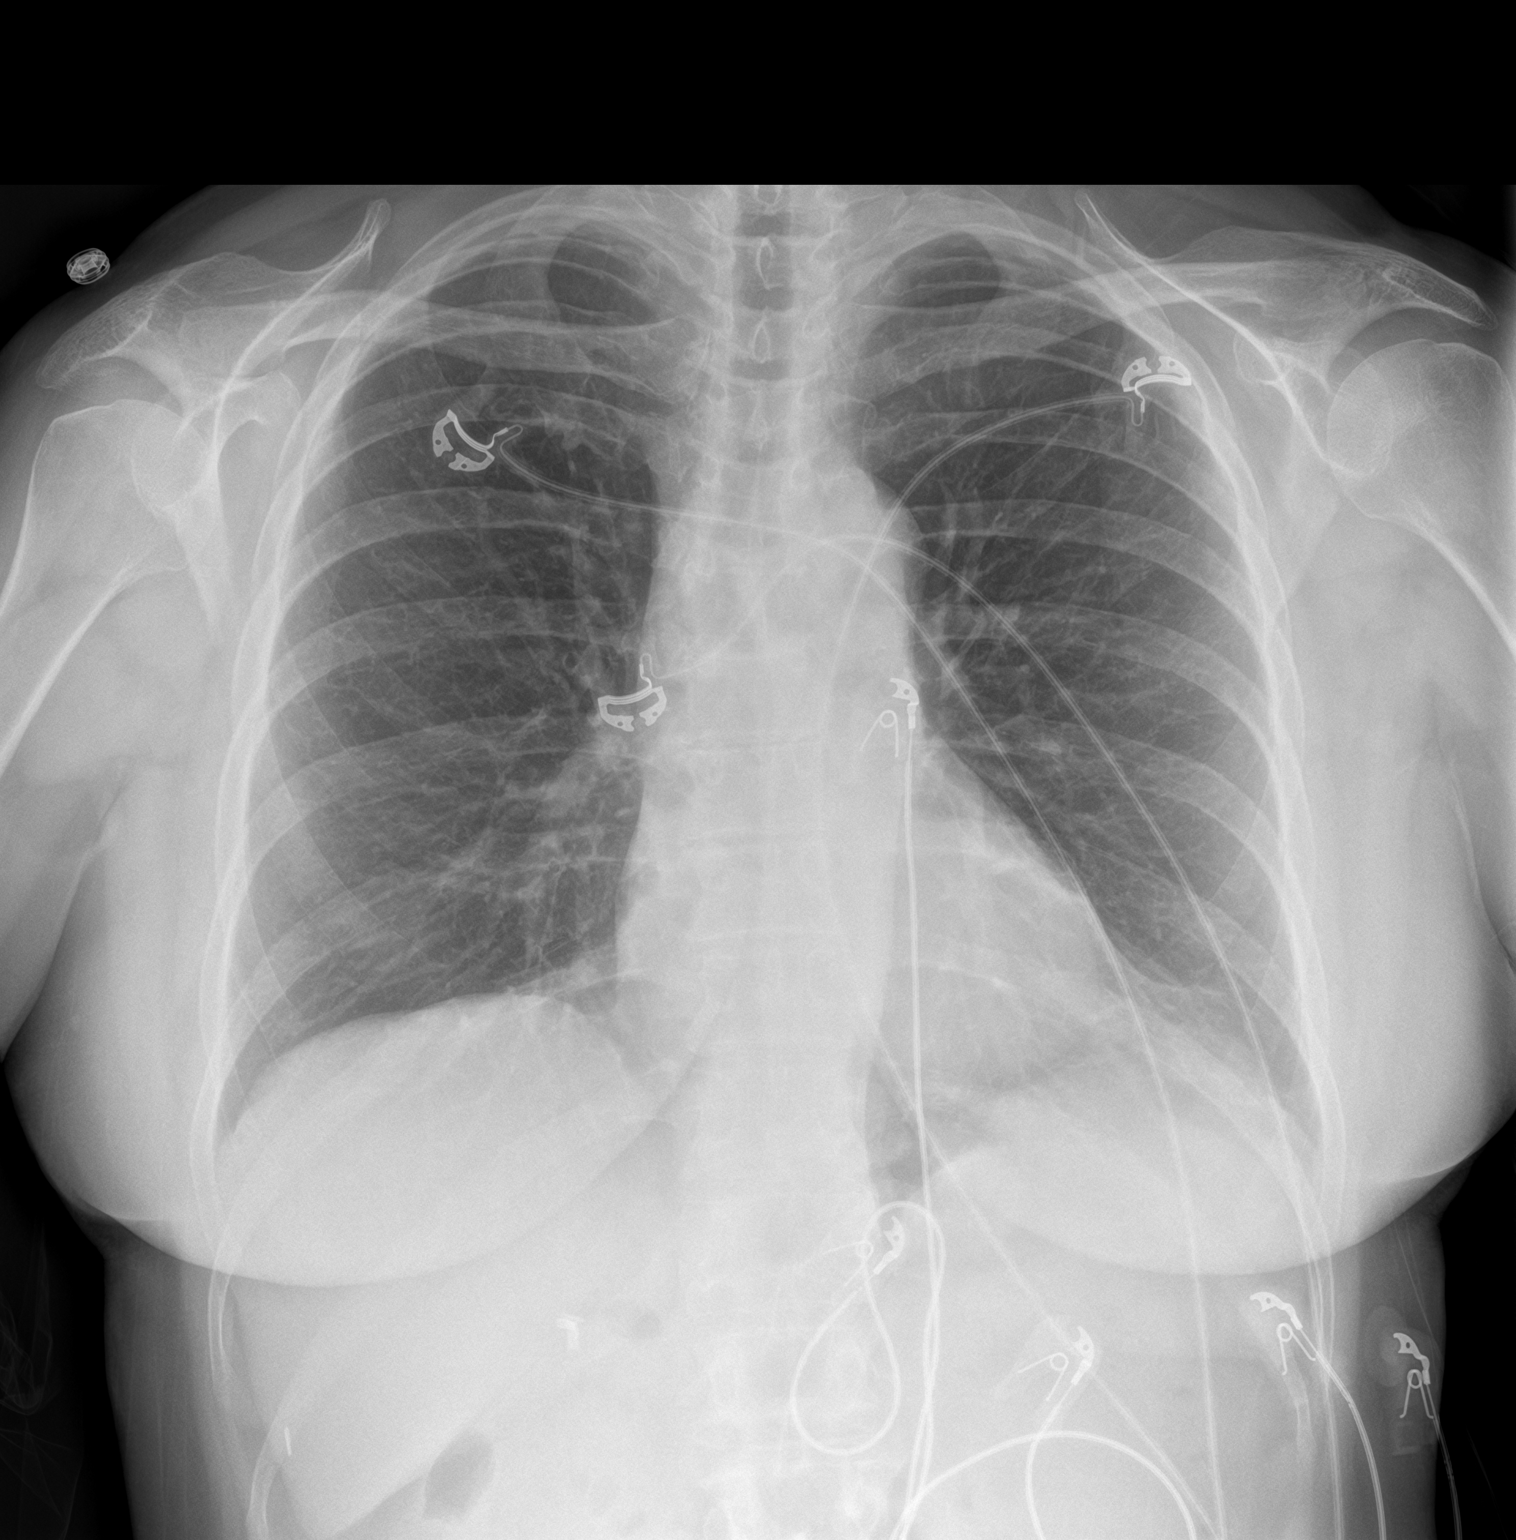

[chest lat]
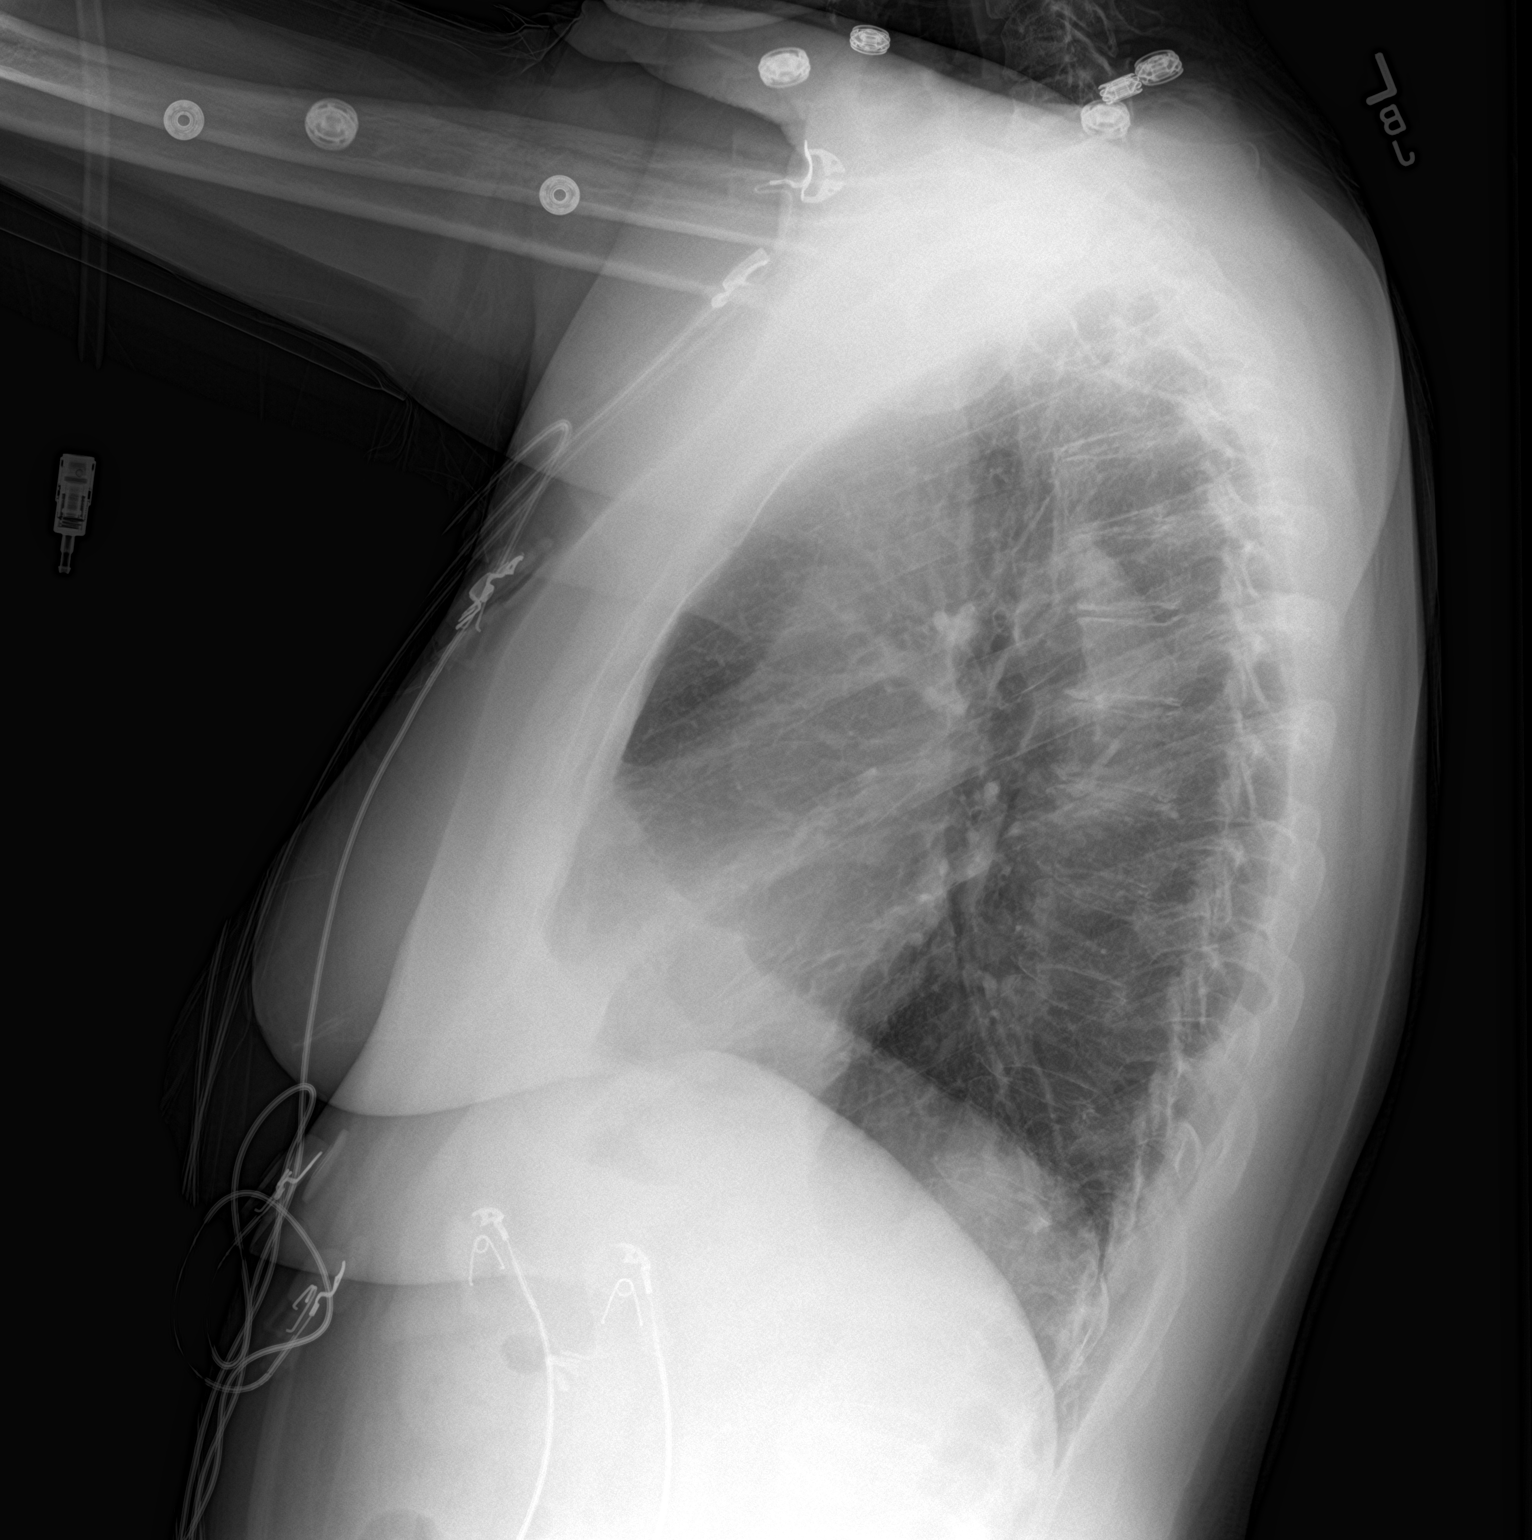

[2 of 2 positions shown; findings below may reference images not displayed]

FINDINGS: The heart size and mediastinal contours are within normal limits.
Both lungs are clear. No pneumothorax or pleural effusion is noted.
The visualized skeletal structures are unremarkable.
IMPRESSION: No active cardiopulmonary disease.

## 2021-08-02 DIAGNOSIS — G4730 Sleep apnea, unspecified: Secondary | ICD-10-CM | POA: Diagnosis not present

## 2021-10-25 DIAGNOSIS — Z1231 Encounter for screening mammogram for malignant neoplasm of breast: Secondary | ICD-10-CM | POA: Diagnosis not present

## 2021-10-25 DIAGNOSIS — Z6828 Body mass index (BMI) 28.0-28.9, adult: Secondary | ICD-10-CM | POA: Diagnosis not present

## 2021-10-25 DIAGNOSIS — Z01419 Encounter for gynecological examination (general) (routine) without abnormal findings: Secondary | ICD-10-CM | POA: Diagnosis not present

## 2021-10-25 DIAGNOSIS — Z1151 Encounter for screening for human papillomavirus (HPV): Secondary | ICD-10-CM | POA: Diagnosis not present

## 2021-10-25 DIAGNOSIS — Z124 Encounter for screening for malignant neoplasm of cervix: Secondary | ICD-10-CM | POA: Diagnosis not present

## 2021-10-25 DIAGNOSIS — L723 Sebaceous cyst: Secondary | ICD-10-CM | POA: Diagnosis not present

## 2021-11-01 DIAGNOSIS — E663 Overweight: Secondary | ICD-10-CM | POA: Diagnosis not present

## 2021-11-01 DIAGNOSIS — Z6828 Body mass index (BMI) 28.0-28.9, adult: Secondary | ICD-10-CM | POA: Diagnosis not present

## 2021-11-01 DIAGNOSIS — L539 Erythematous condition, unspecified: Secondary | ICD-10-CM | POA: Diagnosis not present

## 2021-11-01 DIAGNOSIS — E063 Autoimmune thyroiditis: Secondary | ICD-10-CM | POA: Diagnosis not present

## 2021-11-08 DIAGNOSIS — L723 Sebaceous cyst: Secondary | ICD-10-CM | POA: Diagnosis not present

## 2021-11-08 DIAGNOSIS — N6459 Other signs and symptoms in breast: Secondary | ICD-10-CM | POA: Diagnosis not present

## 2022-03-02 DIAGNOSIS — Z6828 Body mass index (BMI) 28.0-28.9, adult: Secondary | ICD-10-CM | POA: Diagnosis not present

## 2022-03-02 DIAGNOSIS — E663 Overweight: Secondary | ICD-10-CM | POA: Diagnosis not present

## 2022-03-02 DIAGNOSIS — N390 Urinary tract infection, site not specified: Secondary | ICD-10-CM | POA: Diagnosis not present

## 2022-06-12 DIAGNOSIS — Z8249 Family history of ischemic heart disease and other diseases of the circulatory system: Secondary | ICD-10-CM | POA: Diagnosis not present

## 2022-06-12 DIAGNOSIS — Z0000 Encounter for general adult medical examination without abnormal findings: Secondary | ICD-10-CM | POA: Diagnosis not present

## 2022-06-12 DIAGNOSIS — Z6827 Body mass index (BMI) 27.0-27.9, adult: Secondary | ICD-10-CM | POA: Diagnosis not present

## 2022-06-12 DIAGNOSIS — E782 Mixed hyperlipidemia: Secondary | ICD-10-CM | POA: Diagnosis not present

## 2022-06-12 DIAGNOSIS — Z1331 Encounter for screening for depression: Secondary | ICD-10-CM | POA: Diagnosis not present

## 2022-06-12 DIAGNOSIS — E7849 Other hyperlipidemia: Secondary | ICD-10-CM | POA: Diagnosis not present

## 2022-06-12 DIAGNOSIS — E039 Hypothyroidism, unspecified: Secondary | ICD-10-CM | POA: Diagnosis not present

## 2022-06-12 DIAGNOSIS — Z23 Encounter for immunization: Secondary | ICD-10-CM | POA: Diagnosis not present

## 2022-06-12 DIAGNOSIS — L539 Erythematous condition, unspecified: Secondary | ICD-10-CM | POA: Diagnosis not present

## 2022-06-12 DIAGNOSIS — E663 Overweight: Secondary | ICD-10-CM | POA: Diagnosis not present

## 2022-08-27 DIAGNOSIS — E063 Autoimmune thyroiditis: Secondary | ICD-10-CM | POA: Diagnosis not present

## 2022-08-27 DIAGNOSIS — E663 Overweight: Secondary | ICD-10-CM | POA: Diagnosis not present

## 2022-08-27 DIAGNOSIS — J0100 Acute maxillary sinusitis, unspecified: Secondary | ICD-10-CM | POA: Diagnosis not present

## 2022-08-27 DIAGNOSIS — Z6827 Body mass index (BMI) 27.0-27.9, adult: Secondary | ICD-10-CM | POA: Diagnosis not present

## 2022-12-04 DIAGNOSIS — H9313 Tinnitus, bilateral: Secondary | ICD-10-CM | POA: Diagnosis not present

## 2022-12-04 DIAGNOSIS — H6121 Impacted cerumen, right ear: Secondary | ICD-10-CM | POA: Diagnosis not present

## 2022-12-04 DIAGNOSIS — H903 Sensorineural hearing loss, bilateral: Secondary | ICD-10-CM | POA: Diagnosis not present

## 2023-02-21 DIAGNOSIS — Z6828 Body mass index (BMI) 28.0-28.9, adult: Secondary | ICD-10-CM | POA: Diagnosis not present

## 2023-02-21 DIAGNOSIS — Z1231 Encounter for screening mammogram for malignant neoplasm of breast: Secondary | ICD-10-CM | POA: Diagnosis not present

## 2023-02-21 DIAGNOSIS — Z01419 Encounter for gynecological examination (general) (routine) without abnormal findings: Secondary | ICD-10-CM | POA: Diagnosis not present

## 2023-04-10 ENCOUNTER — Other Ambulatory Visit: Payer: Self-pay | Admitting: Oral Surgery

## 2023-04-12 LAB — SURGICAL PATHOLOGY

## 2023-12-04 ENCOUNTER — Encounter (INDEPENDENT_AMBULATORY_CARE_PROVIDER_SITE_OTHER): Payer: Self-pay | Admitting: Otolaryngology

## 2023-12-04 ENCOUNTER — Ambulatory Visit (INDEPENDENT_AMBULATORY_CARE_PROVIDER_SITE_OTHER): Payer: BC Managed Care – PPO | Admitting: Otolaryngology

## 2023-12-04 VITALS — BP 132/83 | HR 67 | Ht 67.0 in | Wt 175.0 lb

## 2023-12-04 DIAGNOSIS — H903 Sensorineural hearing loss, bilateral: Secondary | ICD-10-CM | POA: Insufficient documentation

## 2023-12-04 DIAGNOSIS — H9311 Tinnitus, right ear: Secondary | ICD-10-CM

## 2023-12-04 DIAGNOSIS — H9313 Tinnitus, bilateral: Secondary | ICD-10-CM | POA: Insufficient documentation

## 2023-12-04 DIAGNOSIS — H6121 Impacted cerumen, right ear: Secondary | ICD-10-CM | POA: Diagnosis not present

## 2023-12-04 NOTE — Progress Notes (Signed)
 Patient ID: Kari Branch, female   DOB: [birth year], 68 y.o.   MRN: [id number]  Follow-up: Bilateral tinnitus, hearing loss  HPI: The patient is a 68 year old female who returns today for her follow-up evaluation.  The patient was last seen in May 2025.  At that time, she was complaining of bilateral high-pitched tinnitus.  Her tinnitus was nonpulsatile.  She was noted to have bilateral symmetric high-frequency sensorineural hearing loss.  The strategies to cope with tinnitus were discussed.  The patient returns today reporting no significant change in her tinnitus.  She also denies any change in her hearing.  Currently she denies any otalgia, otorrhea, or vertigo.  Exam: General: Communicates without difficulty, well nourished, no acute distress. Head: Normocephalic, no evidence injury, no tenderness, facial buttresses intact without stepoff. Face/sinus: No tenderness to palpation and percussion. Facial movement is normal and symmetric. Eyes: PERRL, EOMI. No scleral icterus, conjunctivae clear. Neuro: CN II exam reveals vision grossly intact.  No nystagmus at any point of gaze. Ears: Auricles well formed without lesions.  Right ear cerumen impaction.  The left ear canal and tympanic membrane are normal.  Nose: External evaluation reveals normal support and skin without lesions.  Dorsum is intact.  Anterior rhinoscopy reveals congested mucosa over anterior aspect of inferior turbinates and intact septum.  No purulence noted. Oral:  Oral cavity and oropharynx are intact, symmetric, without erythema or edema.  Mucosa is moist without lesions. Neck: Full range of motion without pain.  There is no significant lymphadenopathy.  No masses palpable.  Thyroid  bed within normal limits to palpation.  Parotid glands and submandibular glands equal bilaterally without mass.  Trachea is midline. Neuro:  CN 2-12 grossly intact.   Procedure: Right ear cerumen disimpaction Anesthesia: None Description: Under the operating  microscope, the cerumen is carefully removed with a combination of cerumen currette, alligator forceps, and suction catheters.  After the cerumen is removed, the TMs are noted to be normal.  No mass, erythema, or lesions. The patient tolerated the procedure well.    Assessment: 1.  Right ear cerumen impaction.  After the disimpaction procedure, both tympanic membranes and middle ear spaces are noted to be normal. 2.  Subjectively stable bilateral high-frequency sensorineural hearing loss. 3.  Subjective right ear tinnitus.  Plan: 1.  Otomicroscopy with right ear cerumen disimpaction. 2.  The physical exam findings are reviewed with the patient. 3.  The strategies to cope with tinnitus, including the use of masker, hearing aids, tinnitus retraining therapy, and avoidance of caffeine and alcohol are discussed. 4.  The patient will return for reevaluation in 1 year.
